# Patient Record
Sex: Male | Born: 1980 | Race: Black or African American | Hispanic: No | Marital: Single | Smoking: Never smoker
Health system: Southern US, Community
[De-identification: ages and names within clinical notes are randomized; demographics above are authoritative.]

## PROBLEM LIST (undated history)

## (undated) ENCOUNTER — Emergency Department (HOSPITAL_COMMUNITY): Discharge status: Absent without leave | Payer: Self-pay

---

## 2000-09-23 ENCOUNTER — Emergency Department (HOSPITAL_COMMUNITY): Admission: EM | Admit: 2000-09-23 | Discharge: 2000-09-23 | Payer: Self-pay | Admitting: Emergency Medicine

## 2001-07-12 ENCOUNTER — Emergency Department (HOSPITAL_COMMUNITY): Admission: EM | Admit: 2001-07-12 | Discharge: 2001-07-12 | Payer: Self-pay | Admitting: Emergency Medicine

## 2010-07-16 ENCOUNTER — Emergency Department (HOSPITAL_BASED_OUTPATIENT_CLINIC_OR_DEPARTMENT_OTHER)
Admission: EM | Admit: 2010-07-16 | Discharge: 2010-07-17 | Disposition: A | Discharge status: Home / Self Care | Payer: Self-pay | Attending: Emergency Medicine | Admitting: Emergency Medicine

## 2010-07-16 DIAGNOSIS — X12XXXA Contact with other hot fluids, initial encounter: Secondary | ICD-10-CM | POA: Insufficient documentation

## 2010-07-16 DIAGNOSIS — T22119A Burn of first degree of unspecified forearm, initial encounter: Secondary | ICD-10-CM | POA: Insufficient documentation

## 2010-07-16 DIAGNOSIS — T2019XA Burn of first degree of multiple sites of head, face, and neck, initial encounter: Secondary | ICD-10-CM | POA: Insufficient documentation

## 2011-05-22 ENCOUNTER — Encounter (HOSPITAL_COMMUNITY): Payer: Self-pay | Admitting: Emergency Medicine

## 2011-05-22 ENCOUNTER — Emergency Department (HOSPITAL_COMMUNITY)
Admission: EM | Admit: 2011-05-22 | Discharge: 2011-05-22 | Disposition: A | Discharge status: Home / Self Care | Payer: BC Managed Care – PPO | Attending: Emergency Medicine | Admitting: Emergency Medicine

## 2011-05-22 ENCOUNTER — Emergency Department (HOSPITAL_COMMUNITY): Payer: BC Managed Care – PPO

## 2011-05-22 DIAGNOSIS — J111 Influenza due to unidentified influenza virus with other respiratory manifestations: Secondary | ICD-10-CM

## 2011-05-22 MED ORDER — IBUPROFEN 600 MG PO TABS: 600.0000 mg | ORAL_TABLET | Freq: Four times a day (QID) | ORAL | Status: AC | PRN

## 2011-05-22 MED ORDER — GUAIFENESIN 100 MG/5ML PO SYRP: 100.0000 mg | ORAL_SOLUTION | ORAL | Status: AC | PRN

## 2011-05-22 NOTE — ED Provider Notes (Signed)
Medical screening examination/treatment/procedure(s) were performed by non-physician practitioner and as supervising physician I was immediately available for consultation/collaboration.   Hanley Seamen, MD 05/22/11 [phone]

## 2011-05-22 NOTE — ED Provider Notes (Signed)
History     CSN: [id number]  Arrival date & time 05/22/11  0441   First MD Initiated Contact with Patient 05/22/11 [phone]      Chief Complaint  Patient presents with  . URI    Cold, shivers, lungs hurt    (Consider location/radiation/quality/duration/timing/severity/associated sxs/prior treatment) Patient is a 31 y.o. male presenting with URI. The history is provided by the patient.  URI The primary symptoms include fever. Primary symptoms do not include fatigue, headaches, ear pain, sore throat, abdominal pain, nausea, vomiting, myalgias or rash.  Symptoms associated with the illness include chills. The illness is not associated with congestion or rhinorrhea.   31 year old male with a pertinent past medical history presents with cough, myalgias, sore throat, and low-grade fever and chills which started on Monday. No known sick contacts. States the cough has been nonproductive in nature. He has not actually taken his temperature, but has felt warm. Denies any rhinorrhea, sinus pressure, ear ache, headache. No CP/SOB. He does have a history of asthma. Has taken Tylenol for the fever and has not taken any cough syrup.  History reviewed. No pertinent past medical history.  History reviewed. No pertinent past surgical history.  No family history on file.  History  Substance Use Topics  . Smoking status: Never Smoker   . Smokeless tobacco: Not on file  . Alcohol Use: Yes      Review of Systems  Constitutional: Positive for fever and chills. Negative for activity change, appetite change and fatigue.  HENT: Negative for ear pain, congestion, sore throat, rhinorrhea, trouble swallowing, neck pain and neck stiffness.   Eyes: Negative for discharge and redness.  Respiratory: Negative for chest tightness and shortness of breath.   Cardiovascular: Negative for chest pain and palpitations.  Gastrointestinal: Negative for nausea, vomiting, abdominal pain and diarrhea.  Musculoskeletal:  Negative for myalgias.  Skin: Negative for color change and rash.  Neurological: Negative for headaches.    Allergies  Review of patient's allergies indicates no known allergies.  Home Medications  No current outpatient prescriptions on file.  BP 117/65  Pulse 64  Temp(Src) 97.7 F (36.5 C) (Oral)  Resp 18  SpO2 100%  Physical Exam  Nursing note and vitals reviewed. Constitutional: He is oriented to person, place, and time. He appears well-developed and well-nourished. No distress.  HENT:  Head: Normocephalic and atraumatic.  Right Ear: External ear normal.  Left Ear: External ear normal.  Mouth/Throat: Oropharynx is clear and moist. No oropharyngeal exudate.       Tympanic membranes normal bilaterally. No sinus tenderness to palpation.  Eyes: Conjunctivae and EOM are normal. Pupils are equal, round, and reactive to light. Right eye exhibits no discharge. Left eye exhibits no discharge.  Neck: Normal range of motion. Neck supple.  Cardiovascular: Normal rate, regular rhythm and normal heart sounds.   Pulmonary/Chest: Effort normal and breath sounds normal. He has no wheezes. He exhibits no tenderness.  Abdominal: Soft. Bowel sounds are normal. There is no tenderness.  Musculoskeletal: Normal range of motion.  Lymphadenopathy:    He has no cervical adenopathy.  Neurological: He is alert and oriented to person, place, and time.  Skin: Skin is warm and dry. No rash noted. He is not diaphoretic.  Psychiatric: He has a normal mood and affect.    ED Course  Procedures (including critical care time)  Labs Reviewed - No data to display Dg Chest 2 View  05/22/2011  *RADIOLOGY REPORT*  Clinical Data: Low grade fevers, body  aches, cough, congestion, chest tightness  CHEST - 2 VIEW  Comparison: None.  Findings: The cardiac silhouette, mediastinum, pulmonary vasculature are within normal limits.  Both lungs are clear. There is no acute bony abnormality.  IMPRESSION: There is no  evidence of acute cardiac or pulmonary process.  Original Report Authenticated By: Brandon Melnick, M.D.     1. Influenza-like illness       MDM  MDM Number of Diagnoses or Management Options Influenza-like illness: new, needed workup Diagnosis management comments: Otherwise healthy pt with URI like sx for past several days. Low grade fever self reported at home with myalgias. CXR performed which does not show evidence of PNA. Suspect likely URI/ILI. Will tx symptomatically. Return precautions discussed.    Amount and/or Complexity of Data Reviewed Clinical lab tests: ordered and reviewed Tests in the radiology section of CPT: ordered and reviewed Independent visualization of images, tracings, or specimens: yes  Risk of Complications, Morbidity, and/or Mortality Presenting problems: low Diagnostic procedures: minimal Management options: low  Patient Progress Patient progress: stable         Grant Fontana, [place] 05/22/11 [phone]

## 2011-05-22 NOTE — Discharge Instructions (Signed)
Your chest x-ray did not show any signs of pneumonia. This is likely a viral infection which cannot be treated with antibiotics. Please use the cough syrup as needed. Use the Motrin for body aches and/or fever. Return to the ED with shortness of breath, chest pain, or otherwise worsening condition.  RESOURCE GUIDE  Dental Problems  Patients with Medicaid: [facility] [phone] [address]                                           [phone] [address] Phone:  [phone]                                                  Phone:  [phone]  If unable to pay or uninsured, contact:  [facility] or [facility]. to become qualified for the adult dental clinic.  Chronic Pain Problems Contact [facility]  [phone] Patients need to be referred by their primary care doctor.  Insufficient Money for Medicine Contact [employer]:  call "211" or [facility] [phone].  No Primary Care Doctor Call [facility]  [phone] Other agencies that provide inexpensive medical care    [facility]  [phone]    [facility]  [phone]    [facility]  [phone]    [facility]  [phone]    [facility]  [phone]    [facility]  [phone]  Psychological Services [facility]  [phone] [facility]  [phone] [facility]   [phone] (emergency services [phone])  Substance Abuse Resources [facility]  ([phone] [facility] [phone] [facility] [phone] Floydene Flock [phone] Residential & Outpatient Substance Abuse Program  ([phone]  Abuse/Neglect [facility] Child Abuse Hotline 442-857-2813 [facility] Child Abuse Hotline [phone] (After Hours)  Emergency Shelter [facility] ([phone]  M[facility] [place] [facility] [phone] [facility] (406) [phone]  MRSA Hotline #:   9547860447    [facility] Resources  [facility]     [employer]                          [facility] Dept. [address] [place]                       [address]      [address]  [place]                                                Cristobal Goldmann Phone:  [phone]  Phone:  [phone]                 Phone:  [phone]  [place] [facility] Phone:  [phone]  [place] Child Abuse Hotline (336) 342-1394 [phone]) [phone] (After Hours)   

## 2011-05-22 NOTE — ED Notes (Signed)
MD at bedside. 

## 2011-05-22 NOTE — ED Notes (Signed)
Patient returned from X-ray 

## 2011-05-22 NOTE — ED Notes (Signed)
Patient transported to X-ray 

## 2013-05-30 ENCOUNTER — Emergency Department (HOSPITAL_COMMUNITY)
Admission: EM | Admit: 2013-05-30 | Discharge: 2013-05-30 | Disposition: A | Discharge status: Home / Self Care | Payer: BC Managed Care – PPO | Attending: Emergency Medicine | Admitting: Emergency Medicine

## 2013-05-30 ENCOUNTER — Emergency Department (HOSPITAL_COMMUNITY): Payer: BC Managed Care – PPO

## 2013-05-30 ENCOUNTER — Encounter (HOSPITAL_COMMUNITY): Payer: Self-pay | Admitting: Emergency Medicine

## 2013-05-30 DIAGNOSIS — IMO0002 Reserved for concepts with insufficient information to code with codable children: Secondary | ICD-10-CM | POA: Insufficient documentation

## 2013-05-30 DIAGNOSIS — Y9302 Activity, running: Secondary | ICD-10-CM | POA: Insufficient documentation

## 2013-05-30 DIAGNOSIS — Y929 Unspecified place or not applicable: Secondary | ICD-10-CM | POA: Insufficient documentation

## 2013-05-30 DIAGNOSIS — S20219A Contusion of unspecified front wall of thorax, initial encounter: Secondary | ICD-10-CM | POA: Insufficient documentation

## 2013-05-30 MED ORDER — NAPROXEN 500 MG PO TABS: 500.0000 mg | ORAL_TABLET | Freq: Two times a day (BID) | ORAL | Status: DC

## 2013-05-30 NOTE — ED Provider Notes (Signed)
Medical screening examination/treatment/procedure(s) were performed by non-physician practitioner and as supervising physician I was immediately available for consultation/collaboration.   EKG Interpretation None        Loren Raceravid Winda Summerall, MD 05/30/13 [phone]

## 2013-05-30 NOTE — ED Provider Notes (Signed)
CSN: [id number]     Arrival date & time 05/30/13  4098 History   First MD Initiated Contact with Patient 05/30/13 0435     Chief Complaint  Patient presents with  . Rib Injury    (Consider location/radiation/quality/duration/timing/severity/associated sxs/prior Treatment) Patient is a 33 y.o. male presenting with chest pain. The history is provided by the patient. No language interpreter was used.  Chest Pain Pain location:  R lateral chest Pain quality: aching, stabbing and throbbing   Pain radiates to:  Does not radiate Pain radiates to the back: no   Pain severity:  Mild Onset quality:  Sudden Duration:  2 weeks Timing:  Intermittent Progression:  Unchanged Chronicity:  New Context: trauma (Patient hit right side of chest wall on the corner of a counter in the kitchen 2.5 weeks ago)   Relieved by:  Nothing Worsened by:  Deep breathing and movement Ineffective treatments: Tylenol and ibuprofen. Associated symptoms: no back pain, no cough, no diaphoresis, no fever, no near-syncope, no palpitations, no shortness of breath, no syncope and no weakness     History reviewed. No pertinent past medical history. History reviewed. No pertinent past surgical history. History reviewed. No pertinent family history. History  Substance Use Topics  . Smoking status: Never Smoker   . Smokeless tobacco: Not on file  . Alcohol Use: Yes    Review of Systems  Constitutional: Negative for fever and diaphoresis.  Respiratory: Negative for cough and shortness of breath.        Chest wall pain  Cardiovascular: Positive for chest pain. Negative for palpitations, syncope and near-syncope.  Musculoskeletal: Negative for back pain.  Neurological: Negative for syncope and weakness.  All other systems reviewed and are negative.     Allergies  Review of patient's allergies indicates no known allergies.  Home Medications   Current Outpatient Rx  Name  Route  Sig  Dispense  Refill  .  acetaminophen (TYLENOL) 500 MG tablet   Oral   Take 500 mg by mouth every 6 (six) hours as needed for mild pain.         . naproxen (NAPROSYN) 500 MG tablet   Oral   Take 1 tablet (500 mg total) by mouth 2 (two) times daily.   30 tablet   0    BP 112/68  Pulse 70  Temp(Src) 97.5 F (36.4 C) (Oral)  Resp 20  Ht 5\' 8"  (1.727 m)  Wt 165 lb (74.844 kg)  BMI 25.09 kg/m2  SpO2 98%  Physical Exam  Nursing note and vitals reviewed. Constitutional: He is oriented to person, place, and time. He appears well-developed and well-nourished. No distress.  HENT:  Head: Normocephalic and atraumatic.  Eyes: Conjunctivae and EOM are normal. No scleral icterus.  Neck: Normal range of motion.  Cardiovascular: Normal rate, regular rhythm and normal heart sounds.   Pulmonary/Chest: Effort normal and breath sounds normal. No respiratory distress. He has no wheezes. He has no rales. He exhibits tenderness and bony tenderness. He exhibits no crepitus, no deformity and no retraction.    Musculoskeletal: Normal range of motion.  Neurological: He is alert and oriented to person, place, and time.  Skin: Skin is warm and dry. No rash noted. He is not diaphoretic. No erythema. No pallor.  Psychiatric: He has a normal mood and affect. His behavior is normal.    ED Course  Procedures (including critical care time) Labs Review Labs Reviewed - No data to display Imaging Review Dg Ribs Unilateral W/chest  Right  05/30/2013   CLINICAL DATA:  History of trauma from a fall 2 weeks ago complaining of right lateral rib pain and shortness of breath.  EXAM: RIGHT RIBS AND CHEST - 3+ VIEW  COMPARISON:  Chest x-ray 05/22/2011.  FINDINGS: Lung volumes are normal. No consolidative airspace disease. No pleural effusions. No pneumothorax. No pulmonary nodule or mass noted. Pulmonary vasculature and the cardiomediastinal silhouette are within normal limits.  Dedicated views of the right ribs demonstrate a radiopaque  marker in place over the lower right ribs. No acute displaced right-sided rib fractures are noted.  IMPRESSION: 1. No acute displaced right rib fractures. 2. No radiographic evidence of acute cardiopulmonary disease.   Electronically Signed   By: Trudie Reedaniel  Entrikin M.D.   On: 05/30/2013 05:20     EKG Interpretation None      MDM   Final diagnoses:  Chest wall contusion    Uncomplicated chest wall contusion after patient had his right anterior lateral chest hit the corner of his kitchen counter 2.5 weeks ago. Pain is nonradiating. Patient has point tenderness on exam. No crepitus or retractions. Chest expansion symmetric in patient with clear and normal breath sounds bilaterally. No tachypnea, dyspnea, or hypoxia. Imaging today negative for evidence of pneumothorax or rib fracture. Symptoms consistent with chest wall contusion. Patient states inappropriate for discharge with instruction to take naproxen for pain control. Return precautions provided and patient agreeable to plan with no unaddressed concerns.   Filed Vitals:   05/30/13 0439  BP: 112/68  Pulse: 70  Temp: 97.5 F (36.4 C)  TempSrc: Oral  Resp: 20  Height: 5\' 8"  (1.727 m)  Weight: 165 lb (74.844 kg)  SpO2: 98%     Antony MaduraKelly Shawntez Dickison, PA-C 05/30/13 [phone]
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
## 2013-05-30 NOTE — Discharge Instructions (Signed)
Your x-ray today is negative for rib fracture. Recommend naproxen as prescribed for pain. Apply ice to the area 2-3 times per day. Return if symptoms worsen.  Chest Contusion A chest contusion is a deep bruise on your chest area. Contusions are the result of an injury that caused bleeding under the skin. A chest contusion may involve bruising of the skin, muscles, or ribs. The contusion may turn blue, purple, or yellow. Minor injuries will give you a painless contusion, but more severe contusions may stay painful and swollen for a few weeks. CAUSES  A contusion is usually caused by a blow, trauma, or direct force to an area of the body. SYMPTOMS   Swelling and redness of the injured area.  Discoloration of the injured area.  Tenderness and soreness of the injured area.  Pain. DIAGNOSIS  The diagnosis can be made by taking a history and performing a physical exam. An X-ray, CT scan, or MRI may be needed to determine if there were any associated injuries, such as broken bones (fractures) or internal injuries. TREATMENT  Often, the best treatment for a chest contusion is resting, icing, and applying cold compresses to the injured area. Deep breathing exercises may be recommended to reduce the risk of pneumonia. Over-the-counter medicines may also be recommended for pain control. HOME CARE INSTRUCTIONS   Put ice on the injured area.  Put ice in a plastic bag.  Place a towel between your skin and the bag.  Leave the ice on for 15-20 minutes, 03-04 times a day.  Only take over-the-counter or prescription medicines as directed by your caregiver. Your caregiver may recommend avoiding anti-inflammatory medicines (aspirin, ibuprofen, and naproxen) for 48 hours because these medicines may increase bruising.  Rest the injured area.  Perform deep-breathing exercises as directed by your caregiver.  Stop smoking if you smoke.  Do not lift objects over 5 pounds (2.3 kg) for 3 days or longer if  recommended by your caregiver. SEEK IMMEDIATE MEDICAL CARE IF:   You have increased bruising or swelling.  You have pain that is getting worse.  You have difficulty breathing.  You have dizziness, weakness, or fainting.  You have blood in your urine or stool.  You cough up or vomit blood.  Your swelling or pain is not relieved with medicines. MAKE SURE YOU:   Understand these instructions.  Will watch your condition.  Will get help right away if you are not doing well or get worse. Document Released: 11/19/2000 Document Revised: 11/19/2011 Document Reviewed: 08/18/2011 [facility] Patient Information 2014 [place], [place].

## 2013-06-08 ENCOUNTER — Encounter (HOSPITAL_COMMUNITY): Payer: Self-pay | Admitting: Emergency Medicine

## 2013-06-08 ENCOUNTER — Emergency Department (HOSPITAL_COMMUNITY)
Admission: EM | Admit: 2013-06-08 | Discharge: 2013-06-08 | Disposition: A | Discharge status: Home / Self Care | Payer: BC Managed Care – PPO | Attending: Emergency Medicine | Admitting: Emergency Medicine

## 2013-06-08 DIAGNOSIS — Y9389 Activity, other specified: Secondary | ICD-10-CM | POA: Insufficient documentation

## 2013-06-08 DIAGNOSIS — S199XXA Unspecified injury of neck, initial encounter: Principal | ICD-10-CM

## 2013-06-08 DIAGNOSIS — Z791 Long term (current) use of non-steroidal anti-inflammatories (NSAID): Secondary | ICD-10-CM | POA: Insufficient documentation

## 2013-06-08 DIAGNOSIS — Y92410 Unspecified street and highway as the place of occurrence of the external cause: Secondary | ICD-10-CM | POA: Insufficient documentation

## 2013-06-08 DIAGNOSIS — S298XXA Other specified injuries of thorax, initial encounter: Secondary | ICD-10-CM | POA: Insufficient documentation

## 2013-06-08 DIAGNOSIS — S0993XA Unspecified injury of face, initial encounter: Secondary | ICD-10-CM | POA: Insufficient documentation

## 2013-06-08 MED ORDER — CYCLOBENZAPRINE HCL 10 MG PO TABS: 10.0000 mg | ORAL_TABLET | Freq: Two times a day (BID) | ORAL | Status: DC | PRN

## 2013-06-08 MED ORDER — IBUPROFEN 800 MG PO TABS: 800.0000 mg | ORAL_TABLET | Freq: Three times a day (TID) | ORAL | Status: DC

## 2013-06-08 MED ORDER — HYDROCODONE-ACETAMINOPHEN 5-325 MG PO TABS: 1.0000 | ORAL_TABLET | Freq: Four times a day (QID) | ORAL | Status: DC | PRN
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
## 2013-06-08 NOTE — ED Provider Notes (Signed)
Medical screening examination/treatment/procedure(s) were performed by non-physician practitioner and as supervising physician I was immediately available for consultation/collaboration.   EKG Interpretation None        Gilda Creasehristopher J. Shadman Tozzi, MD 06/08/13 [phone]
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
## 2013-12-14 ENCOUNTER — Emergency Department (HOSPITAL_COMMUNITY): Payer: BC Managed Care – PPO

## 2013-12-14 ENCOUNTER — Emergency Department (HOSPITAL_COMMUNITY)
Admission: EM | Admit: 2013-12-14 | Discharge: 2013-12-14 | Disposition: A | Discharge status: Home / Self Care | Payer: BC Managed Care – PPO | Attending: Emergency Medicine | Admitting: Emergency Medicine

## 2013-12-14 ENCOUNTER — Encounter (HOSPITAL_COMMUNITY): Payer: Self-pay | Admitting: Emergency Medicine

## 2013-12-14 DIAGNOSIS — Y9289 Other specified places as the place of occurrence of the external cause: Secondary | ICD-10-CM | POA: Insufficient documentation

## 2013-12-14 DIAGNOSIS — W208XXA Other cause of strike by thrown, projected or falling object, initial encounter: Secondary | ICD-10-CM | POA: Insufficient documentation

## 2013-12-14 DIAGNOSIS — S62629A Displaced fracture of medial phalanx of unspecified finger, initial encounter for closed fracture: Secondary | ICD-10-CM

## 2013-12-14 DIAGNOSIS — S62622A Displaced fracture of medial phalanx of right middle finger, initial encounter for closed fracture: Secondary | ICD-10-CM | POA: Insufficient documentation

## 2013-12-14 DIAGNOSIS — Y9389 Activity, other specified: Secondary | ICD-10-CM | POA: Insufficient documentation

## 2013-12-14 NOTE — ED Provider Notes (Signed)
CSN: [id number]     Arrival date & time 12/14/13  1039 History  This chart was scribed for Oswaldo Conroy, [place] with Shon Baton, MD by Tonye Royalty, ED Scribe. This patient was seen in room WTR8/WTR8 and the patient's care was started at 11:59 AM.    Chief Complaint  Patient presents with  . Finger Injury   The history is provided by the patient. No language interpreter was used.    HPI Comments: Rodney Mclaughlin is a 33 y.o. male who presents to the Emergency Department complaining of injury to his right middle finger yesterday at 3:00PM when metal pipe he was lifting fell on it. He rates his pain now at 7/10 and is pulsing. He states the pain has not increased yesterday but the swelling has increased. He denies a wound. He states he has not taken any medication for his pain. He denies fever, chills, nausea, vomiting, or diarrhea.  History reviewed. No pertinent past medical history. History reviewed. No pertinent past surgical history. History reviewed. No pertinent family history. History  Substance Use Topics  . Smoking status: Never Smoker   . Smokeless tobacco: Not on file  . Alcohol Use: Yes    Review of Systems  Constitutional: Negative for fever and chills.  Gastrointestinal: Negative for nausea, vomiting and diarrhea.  Musculoskeletal:       Swelling and pain to finger      Allergies  Review of patient's allergies indicates no known allergies.  Home Medications   Prior to Admission medications   Not on File   BP 115/76  Pulse 76  Temp(Src) 98.7 F (37.1 C) (Oral)  Resp 20  SpO2 100% Physical Exam  Nursing note and vitals reviewed. Constitutional: He appears well-developed and well-nourished. No distress.  HENT:  Head: Normocephalic and atraumatic.  Eyes: Conjunctivae are normal. Right eye exhibits no discharge. Left eye exhibits no discharge.  Cardiovascular:  2+ bilateral radial pulses  Pulmonary/Chest: Effort normal. No respiratory distress.   Musculoskeletal: Normal range of motion. He exhibits tenderness (distal right middle finger).  5/5 strength in hand and wrist. Patient moves all fingers. Right middle phalanx tip with erythema and edema, pain with ROM. No subungual hematoma.  Neurological: He is alert. Coordination normal.  Skin: He is not diaphoretic.  Psychiatric: He has a normal mood and affect. His behavior is normal.    ED Course  Procedures (including critical care time) Labs Review Labs Reviewed - No data to display  Imaging Review Dg Finger Middle Right  12/14/2013   CLINICAL DATA:  Injury to the distal aspect of RIGHT middle finger yesterday 1500 hr at work, middle finger caught/smashed between metal pipes, initial encounter  EXAM: RIGHT MIDDLE FINGER 2+V  COMPARISON:  None  FINDINGS: Osseous mineralization normal.  Joint spaces preserved.  Essentially nondisplaced tuft fracture at distal phalanx RIGHT middle finger.  No extension to DIP joint.  No additional fracture, dislocation or bone destruction.  IMPRESSION: Essentially nondisplaced tuft fracture at distal phalanx RIGHT middle finger.   Electronically Signed   By: Ulyses Southward M.D.   On: 12/14/2013 12:34     EKG Interpretation None     DIAGNOSTIC STUDIES: Oxygen Saturation is 100% on room air, normal by my interpretation.    COORDINATION OF CARE: 12:04 PM Will order x-ray to rule out acute fracture. Discussed with the patient the possibility that swelling is resulting in compression of the vein and possible compartment syndrome. Patient agrees with the plan and has no  further questions at this time. He declines Ibuprofen.  Spoke with Dr. Wilkie AyeHorton. X-ray shows evidence of fracture. Since examination shows no evidence of hemorrhage, will administer a splint. Instructed patient to return if symptoms worsen. And follow up with Orthopedics/hand.   MDM   Final diagnoses:  Fracture of finger, middle phalanx, right, closed, initial encounter   Patient with  tuff fracture, nondisplaced of right middle finger, closed fracture. VSS. Neurovascularly intact. Pain with ROM, no subungual hematoma. Patient without fevers, chills, severe pain. I doubt compartment syndrome. Patient without subungual hematoma. It is a closed fracture. Patient given return precautions for compartment syndrome. Patient to follow up with Dr. Melvyn Novasrtmann with Hand/orthopedics. In formation provided. RICE protocol and pain medications indicated but Patient did not want any pain meds in ED or narcotics to take home. Discussed other symptomatic treatment.   Discussed return precautions with patient. Discussed all results and patient verbalizes understanding and agrees with plan.  This is a shared patient. This patient was discussed with the physician who saw and evaluated the patient and agrees with the plan.  I personally performed the services described in this documentation, which was scribed in my presence. The recorded information has been reviewed and is accurate.     Louann SjogrenVictoria L Silvana Holecek, PA-C 12/14/13 1422

## 2013-12-14 NOTE — Discharge Instructions (Signed)
Return to the emergency room with worsening of symptoms, new symptoms or with symptoms that are concerning, especially fevers, swelling, redness, increased pain. Read attachement for compartment syndrome. Call number above for appointment to see hand surgeon for further evaluation and management of fracture. RICE: Rest, Ice (three cycles of 20 mins on, 20mins off at least twice a day), compression/brace, elevation. Heating pad works well for back pain. Ibuprofen 400mg  (2 tablets 200mg ) every 5-6 hours for 3-5 days and then as needed for pain.

## 2013-12-14 NOTE — ED Notes (Signed)
Smashed rt middle finger in between furniture

## 2013-12-17 NOTE — ED Provider Notes (Signed)
Medical screening examination/treatment/procedure(s) were conducted as a shared visit with non-physician practitioner(s) and myself.  I personally evaluated the patient during the encounter.   EKG Interpretation None      Patient presents with gross injury to the right third finger. No obvious open  Injury. No subungual hematoma. Moderate swelling. Evidence of tuft fracture. We'll splint and patient will be discharged home with pain management.  Shon Batonourtney F Johnn Krasowski, MD 12/17/13 [phone]

## 2015-08-28 ENCOUNTER — Emergency Department (HOSPITAL_COMMUNITY): Payer: No Typology Code available for payment source

## 2015-08-28 ENCOUNTER — Encounter (HOSPITAL_COMMUNITY): Payer: Self-pay | Admitting: Emergency Medicine

## 2015-08-28 ENCOUNTER — Emergency Department (HOSPITAL_COMMUNITY)
Admission: EM | Admit: 2015-08-28 | Discharge: 2015-08-28 | Disposition: A | Discharge status: Home / Self Care | Payer: No Typology Code available for payment source | Attending: Emergency Medicine | Admitting: Emergency Medicine

## 2015-08-28 DIAGNOSIS — Y939 Activity, unspecified: Secondary | ICD-10-CM | POA: Insufficient documentation

## 2015-08-28 DIAGNOSIS — Y999 Unspecified external cause status: Secondary | ICD-10-CM | POA: Insufficient documentation

## 2015-08-28 DIAGNOSIS — M20012 Mallet finger of left finger(s): Secondary | ICD-10-CM | POA: Insufficient documentation

## 2015-08-28 DIAGNOSIS — W1839XA Other fall on same level, initial encounter: Secondary | ICD-10-CM | POA: Insufficient documentation

## 2015-08-28 DIAGNOSIS — Y929 Unspecified place or not applicable: Secondary | ICD-10-CM | POA: Insufficient documentation

## 2015-08-28 NOTE — ED Provider Notes (Signed)
CSN: [id number]     Arrival date & time 08/28/15  1225 History  By signing my name below, I, Soijett Blue, attest that this documentation has been prepared under the direction and in the presence of Wynetta EmeryNicole Dedria Endres, PA-C Electronically Signed: Soijett Blue, ED Scribe. 08/28/2015. 2:09 PM.   Chief Complaint  Patient presents with  . Finger Injury      The history is provided by the patient. No language interpreter was used.    Wilma FlavinSandy Eilts is a 35 y.o. male who presents to the Emergency Department complaining of left pinky finger injury occurring 3 days ago. Pt reports that he fell striking his left hand 4 days ago and he is right hand dominant. Pt notes that he noticed the deformity 8 hours following the incident. Pt notes that he is unable to straighten the tip of his finger. Pt denies pain to the area. He notes that he has not tried any medications for the relief of his symptoms. He denies color change, wound, rash, swelling, and any other symptoms.   No past medical history on file. No past surgical history on file. No family history on file. Social History  Substance Use Topics  . Smoking status: Never Smoker   . Smokeless tobacco: Not on file  . Alcohol Use: Yes    Review of Systems  A complete 10 system review of systems was obtained and all systems are negative except as noted in the HPI and PMH.   Allergies  Review of patient's allergies indicates no known allergies.  Home Medications   Prior to Admission medications   Not on File   BP 129/67 mmHg  Pulse 78  Temp(Src) 98.6 F (37 C) (Oral)  Resp 18  SpO2 100% Physical Exam  Constitutional: He is oriented to person, place, and time. He appears well-developed and well-nourished. No distress.  HENT:  Head: Normocephalic and atraumatic.  Eyes: EOM are normal.  Neck: Neck supple.  Cardiovascular: Normal rate.   Pulmonary/Chest: Effort normal. No respiratory distress.  Abdominal: He exhibits no distension.   Musculoskeletal: Normal range of motion.  No overlying skin changes. No nailbed injury. Pt keeps the DIP of left 5th digit in flexion. No active extension. Distally and NVI.  Neurological: He is alert and oriented to person, place, and time.  Skin: Skin is warm and dry.  Psychiatric: He has a normal mood and affect. His behavior is normal.  Nursing note and vitals reviewed.   ED Course  Procedures (including critical care time) DIAGNOSTIC STUDIES: Oxygen Saturation is 100% on RA, nl by my interpretation.    COORDINATION OF CARE: 2:09 PM Discussed treatment plan with pt at bedside which includes left little finger xray and referral to orthopedist, and pt agreed to plan.    Labs Review Labs Reviewed - No data to display  Imaging Review Dg Finger Little Left  08/28/2015  CLINICAL DATA:  Deformity. EXAM: LEFT LITTLE FINGER 2+V COMPARISON:  No recent prior. FINDINGS: No acute soft tissue or bony abnormality identified. Flexion deformity noted of the DIP. No radiopaque foreign body. IMPRESSION: Flexion deformity noted of the DIP. No acute bony or joint abnormalities identified. Electronically Signed   By: Maisie Fushomas  Register   On: 08/28/2015 13:10   I have personally reviewed and evaluated these images as part of my medical decision-making.   EKG Interpretation None      MDM   Final diagnoses:  Mallet finger, acquired, left    Filed Vitals:   08/28/15  1240 08/28/15 1448  BP: 129/67   Pulse: 78 64  Temp: 98.6 F (37 C)   TempSrc: Oral   Resp: 18   SpO2: 100% 100%    Okechukwu Regnier is 35 y.o. male presenting with Left fifth digit mallet finger. Patient states that he fell 5 days ago there's been no pain, x-ray negative, patient placed in splints, call Dr. Merrilee Seashore office who has generously made appointment for tomorrow.  Evaluation does not show pathology that would require ongoing emergent intervention or inpatient treatment. Pt is hemodynamically stable and mentating  appropriately. Discussed findings and plan with patient/guardian, who agrees with care plan. All questions answered. Return precautions discussed and outpatient follow up given.       Joni Reining Joscelyne Renville, PA-C 08/28/15 1610  Vanetta Mulders, MD 08/29/15 1004

## 2015-08-28 NOTE — ED Notes (Signed)
Pt with deformed left pinky finger x 3 days.  Does not recall injury but cannot straighten the tip.  Pt denies pain

## 2015-08-28 NOTE — Discharge Instructions (Signed)
Do not hesitate to return to the emergency room for any new, worsening or concerning symptoms.  Please obtain primary care using resource guide below. Let them know that you were seen in the emergency room and that they will need to obtain records for further outpatient management.     Mallet Finger Mallet finger is an injury that occurs from a blow to the tip of your straightened finger or thumb. It is also known as baseball finger. The blow to your fingertip causes it to bend farther than normal, which tears the cord that attaches to the tip of your finger (extensor tendon). Your extensor tendon is what straightens the end of your finger. If this tendon is damaged, you will not be able to straighten your fingertip. Sometimes, a piece of bone may be pulled away with the tendon (avulsion injury), or the tendon may tear completely. In some cases, surgery may be required to repair the damage. CAUSES Mallet finger is caused by a hard, direct hit to the tip of your finger or thumb. This injury often happens from getting hit in the finger with a hard ball, such as a baseball. RISK FACTORS This injury is more likely to happen if you play sports that use a hard ball. SYMPTOMS  The main symptom of this injury is not being able to straighten the tip of your finger. You can manually straighten your fingertip with your other hand, but the finger cannot straighten on its own. Other symptoms may include:  Pain.  Swelling.  Bruising.  Blood under the fingernail. DIAGNOSIS  Your health care provider may suspect mallet finger if you are not able to extend your fingertip, especially if you recently injured your hand. Your health care provider will do a physical exam. This may include X-rays to see if a piece of bone has been pulled away or if the finger joint has separated (dislocated). TREATMENT  Mallet finger may be treated with:  Wearing a splint on your fingertip to keep it straight (extended) while  the tendon heals.  Surgery to repair the tendon, in severe cases. This may involve:  The use of a pin or screw to keep your finger extended and your tendon attached.  Taking a piece of tendon from another part of your body (graft) to replace a torn tendon. HOME CARE INSTRUCTIONS   Take medicines only as directed by your health care provider.  Wear the splint as directed by your health care provider. Remove it only as directed by your health care provider.  If you take your splint off to dry it or change it, gently press your finger on a flat surface to keep it straight.  If directed, apply ice to the injured area:   Put ice in a plastic bag.   Place a towel between your skin and the bag.   Leave the ice on for 20 minutes, 2-3 times a day.  Raise the injured area above the level of your heart while you are sitting or lying down. SEEK MEDICAL CARE IF:   You have pain or swelling that is getting worse.   Your finger feels cold.   You cannot extend your finger after treatment. SEEK IMMEDIATE MEDICAL CARE IF:   Even after loosening your splint, your finger is:  Very red and swollen.  White or blue.  Numb or tingling.   This information is not intended to replace advice given to you by your health care provider. Make sure you discuss any questions  you have with your health care provider.   Document Released: 02/22/2000 Document Revised: 07/11/2014 Document Reviewed: 12/28/2013 Elsevier Interactive Patient Education 2016 [employer]. [employer] Assistance The United Ways 211 is a great source of information about community services available.  Access by dialing 2-1-1 from anywhere in [place], or by website -  PooledIncome.pl.   Other Local Resources (Updated 03/2015)  Financial Assistance   Services    Phone Number and Address  [facility]  Low-cost medical care - 1st and 3rd Saturday of every month  Must not  qualify for public or private insurance and must have limited income [phone] [address] [place], [place]    [place] [employer] of Social Services  Child care  Emergency assistance for housing and [employer]  Medicaid [phone] [address] [place], [place] [address]   [facility] Department  Low-cost medical care for children, communicable diseases, sexually-transmitted diseases, immunizations, maternity care, womens health and family planning [phone] [address] [place], [place] [address]  [facility]   Medication assistance for [facility] residents  Must meet income requirements [phone] [address] [place], [place].    [facility] Social Services  Child care  Emergency assistance for housing and [employer]  Medicaid [phone] [address] [place], [place] [address]  [facility]   Low-cost medical care,   Monday through Friday, 9 am to 6 pm.   Accepts Medicare/Medicaid, and self-pay [phone] [address] [place], [place] [address]  [facility]  Low-cost medical care - Monday through Friday, 8:30 am - 5:30 pm  Accepts Medicaid and self-pay [phone] [address] [place], [place] [address]   [facility]  Primary medical care, including for those with sickle cell disease  Accepts Medicare, Medicaid, insurance and self-pay [phone] [address] [employer], [place]  [facility]   Primary medical care  Accepts Medicare, [place], insurance and self-pay [phone] [address] [place], [place] [address]   [facility] Department of Social Services  Child care  Emergency assistance for housing and [employer]  Medicaid [phone] [address]  [place], [place] [address]  [facility] Department of Health and [employer]  Child care  Emergency assistance for housing and [employer]  Medicaid [phone] [address] [place], [place] [address]   [facility] Medication Assistance Program  Medication assistance for [facility] residents with no insurance only  Must have a primary care doctor [phone] [address] [place], [place]  [facility]   Primary medical care  [place], [place], insurance  [phone] [address] [place], [place]  [facility]   Medication assistance [phone]  [facility]   Primary medical care  Accepts Medicare, [place], insurance and self-pay [phone] [address] [place], [place] [address]  [facility]   Primary medical care  Accepts Medicare, [place], insurance and self-pay [phone] [address] [place], [place] [address]  [facility]  For [place] residents between the ages of 78 and 46 who do not have any form of health insurance, Medicare, [place], or [place] benefits.  Services are provided free of charge to uninsured patients who fall within federal poverty guidelines.    Hours: Tuesdays and Thursdays, 4:15 -  8 pm [phone] [address], [address] [place], [place] [address]  [facility]     Primary medical care  Dental care  Nutritional counseling  Pharmacy  Accepts Medicaid, Medicare, most insurance.  Fees are adjusted based on ability to pay.   [phone] [facility] [address] [place], [place]  [phone] [facility] [address] [place], [place]  [phone] [facility] [place], [place]  [phone] [facility], [address] [place], [place]  [phone] 1597 [facility] [address] [place], [place]  [facility]  Womens health and family planning [phone] [place], [place]  [facility] of Social Services  Child care  Emergency assistance for housing and [employer]ark[employer]ilities  Food stamps  Medicaid [phone] [address], [place], [place] [address]   [facility]    Ages 6518 and older  Hours: Mondays and Thursdays, 7:00 am - 9:00 am Patients are seen on a first come, first served basis. [phone], [phone] [place], [place]  [facility] of Social Services  Child care  Emergency assistance for housing and [employer]ark[employer]ilities  Food stamps  Medicaid [phone] [address] [place], [place] [address]  [facility] [employer]  Medication assistance  Rental assistance  Food pantry  Medication assistance  Housing assistance  Emergency food distribution  Utility assistance [phone] [address] [place], [place]  [phone]  [address] [place], [place] [address] Hours: Tuesdays and Thursdays from 9am - 12 noon by appointment only  [phone] [address] [place], [place] [address]  [facility] - Lanae Boastlara F. Gunn   Accepts private insurance, [place]are, and [place]icaid.  Payment is based on a sliding scale for those without insurance.  Hours: Mondays, Tuesdays and Thursdays, 8:30 am - 5:30 pm.   [phone] [address] , [place]  [facility] - Family [facility] at [facility]    Accepts private insurance, [place], and [place].  Payment is based on a sliding scale for those without insurance. [phone] [address] [place], [place]  [facility] - Pediatrics at [address] Scientist, research (physical sciences)[place]  Accepts private insurance, [employer]icare, and [place]icaid.  Payment is based on a sliding scale for those without insurance [phone] [address], [place], [place]  [facility] - [facility] at [facility]  Accepts private insurance, [place]are, and [place].  Payment is based on a sliding scale for those without insurance. [phone] [address] [place], [place]  [facility] - [facility] at [facility]  Accepts private insurance, [place], and [place].  Payment is based on a sliding scale for those without insurance. [phone], [phone]. [phone] [place], [place].    [facility]  Maternity care.  Accepts Medicaid and self-pay. [phone] [address] [place], [place]

## 2019-03-06 ENCOUNTER — Other Ambulatory Visit: Payer: Self-pay

## 2019-03-06 ENCOUNTER — Emergency Department (HOSPITAL_COMMUNITY)
Admission: EM | Admit: 2019-03-06 | Discharge: 2019-03-06 | Disposition: A | Discharge status: Home / Self Care | Payer: Self-pay | Attending: Emergency Medicine | Admitting: Emergency Medicine

## 2019-03-06 ENCOUNTER — Encounter (HOSPITAL_COMMUNITY): Payer: Self-pay

## 2019-03-06 DIAGNOSIS — M791 Myalgia, unspecified site: Secondary | ICD-10-CM | POA: Insufficient documentation

## 2019-03-06 DIAGNOSIS — J111 Influenza due to unidentified influenza virus with other respiratory manifestations: Secondary | ICD-10-CM

## 2019-03-06 DIAGNOSIS — Z20828 Contact with and (suspected) exposure to other viral communicable diseases: Secondary | ICD-10-CM | POA: Insufficient documentation

## 2019-03-06 DIAGNOSIS — Z20822 Contact with and (suspected) exposure to covid-19: Secondary | ICD-10-CM

## 2019-03-06 DIAGNOSIS — R6883 Chills (without fever): Secondary | ICD-10-CM | POA: Insufficient documentation

## 2019-03-06 LAB — INFLUENZA PANEL BY PCR (TYPE A & B)
Influenza A By PCR: NEGATIVE
Influenza B By PCR: NEGATIVE

## 2019-03-06 NOTE — ED Provider Notes (Signed)
[facility]-EMERGENCY DEPT Provider Note   CSN: [id number] Arrival date & time: 03/06/19  2022     History Chief Complaint  Patient presents with  . Generalized Body Aches  . loss of taste    Rodney Mclaughlin is a 38 y.o. male who presents to ED for 2-day history of generalized body aches, loss of taste, chills.  Patient is requesting Covid testing.  Denies any known COVID-19 exposures but states that "maybe because some people don't even know they have it."  States that his mother was diagnosed with COVID-19 but he was not around her during her exposure.  He has not taken any medications to help with his symptoms.  Denies any chest pain, abdominal pain, shortness of breath, cough, vomiting.  HPI     No past medical history on file.  There are no problems to display for this patient.   No past surgical history on file.     No family history on file.  Social History   Tobacco Use  . Smoking status: Never Smoker  Substance Use Topics  . Alcohol use: Yes  . Drug use: No    Home Medications Prior to Admission medications   Not on File    Allergies    Patient has no known allergies.  Review of Systems   Review of Systems  Constitutional: Positive for chills. Negative for fever.  HENT:       +loss of taste  Respiratory: Negative for shortness of breath.   Musculoskeletal: Positive for myalgias.  Neurological: Negative for weakness and numbness.    Physical Exam Updated Vital Signs BP 121/83   Pulse 78   Temp 99.3 F (37.4 C) (Oral)   Resp 18   SpO2 98%   Physical Exam Vitals and nursing note reviewed.  Constitutional:      General: He is not in acute distress.    Appearance: He is well-developed. He is not diaphoretic.  HENT:     Head: Normocephalic and atraumatic.  Eyes:     General: No scleral icterus.    Conjunctiva/sclera: Conjunctivae normal.  Pulmonary:     Effort: Pulmonary effort is normal. No respiratory distress.    Musculoskeletal:     Cervical back: Normal range of motion.  Skin:    Findings: No rash.  Neurological:     Mental Status: He is alert.     ED Results / Procedures / Treatments   Labs (all labs ordered are listed, but only abnormal results are displayed) Labs Reviewed  NOVEL CORONAVIRUS, NAA (HOSP ORDER, SEND-OUT TO REF LAB; TAT 18-24 HRS)  INFLUENZA PANEL BY PCR (TYPE A & B)    EKG None  Radiology No results found.  Procedures Procedures (including critical care time)  Medications Ordered in ED Medications - No data to display  ED Course  I have reviewed the triage vital signs and the nursing notes.  Pertinent labs & imaging results that were available during my care of the patient were reviewed by me and considered in my medical decision making (see chart for details).    MDM Rules/Calculators/A&P                      Rodney Mclaughlin was evaluated in Emergency Department on 03/06/19  for the symptoms described in the history of present illness. He/she was evaluated in the context of the global COVID-19 pandemic, which necessitated consideration that the patient might be at risk for infection with the  SARS-CoV-2 virus that causes COVID-19. Institutional protocols and algorithms that pertain to the evaluation of patients at risk for COVID-19 are in a state of rapid change based on information released by regulatory bodies including the CDC and federal and state organizations. These policies and algorithms were followed during the patient's care in the ED.  38 year old male presents to ED for chills, body aches and loss of taste for the past 2 days.  He is requesting a Covid test.  Denies any cough, chest pain, shortness of breath.  He is afebrile here with no recent use of antipyretics.  Will obtain Covid swab as well as influenza test.  Patient will follow up with results and we will have him continue Tylenol and ibuprofen as needed for pain.  Patient is hemodynamically  stable, in NAD, and able to ambulate in the ED. Evaluation does not show pathology that would require ongoing emergent intervention or inpatient treatment. I explained the diagnosis to the patient. Pain has been managed and has no complaints prior to discharge. Patient is comfortable with above plan and is stable for discharge at this time. All questions were answered prior to disposition. Strict return precautions for returning to the ED were discussed. Encouraged follow up with PCP.   An After Visit Summary was printed and given to the patient.   Portions of this note were generated with [employer]. Dictation errors may occur despite best attempts at proofreading.   Final Clinical Impression(s) / ED Diagnoses Final diagnoses:  Influenza-like illness  Suspected COVID-19 virus infection    Rx / DC Orders ED Discharge Orders    None       Delia Heady, PA-C 03/06/19 2347    Sherwood Gambler, MD 03/10/19 [phone]

## 2019-03-06 NOTE — Discharge Instructions (Addendum)
We will contact you with the results of your remaining lab work when it is available. In the meantime you can take Tylenol, ibuprofen and follow instructions regarding quarantine.

## 2019-03-06 NOTE — ED Triage Notes (Signed)
Pt coming in c/o generalized body aches, chills, loss of taste x2 days.

## 2019-03-08 LAB — NOVEL CORONAVIRUS, NAA (HOSP ORDER, SEND-OUT TO REF LAB; TAT 18-24 HRS): SARS-CoV-2, NAA: NOT DETECTED

## 2019-09-28 ENCOUNTER — Emergency Department (HOSPITAL_COMMUNITY): Admission: EM | Admit: 2019-09-28 | Discharge: 2019-09-28 | Discharge status: Against Medical Advice | Payer: Self-pay

## 2019-09-28 ENCOUNTER — Other Ambulatory Visit: Payer: Self-pay

## 2019-09-28 ENCOUNTER — Encounter (HOSPITAL_COMMUNITY): Payer: Self-pay

## 2019-09-28 ENCOUNTER — Emergency Department (HOSPITAL_COMMUNITY)
Admission: EM | Admit: 2019-09-28 | Discharge: 2019-09-28 | Disposition: A | Discharge status: Home / Self Care | Payer: Self-pay | Attending: Emergency Medicine | Admitting: Emergency Medicine

## 2019-09-28 DIAGNOSIS — R1031 Right lower quadrant pain: Secondary | ICD-10-CM | POA: Insufficient documentation

## 2019-09-28 DIAGNOSIS — Z5321 Procedure and treatment not carried out due to patient leaving prior to being seen by health care provider: Secondary | ICD-10-CM | POA: Insufficient documentation

## 2019-09-28 DIAGNOSIS — R1110 Vomiting, unspecified: Secondary | ICD-10-CM | POA: Insufficient documentation

## 2019-09-28 LAB — URINALYSIS, ROUTINE W REFLEX MICROSCOPIC
Bacteria, UA: NONE SEEN
Glucose, UA: NEGATIVE mg/dL
Hgb urine dipstick: NEGATIVE
Ketones, ur: 5 mg/dL — AB
Nitrite: NEGATIVE
Protein, ur: 30 mg/dL — AB
Specific Gravity, Urine: 1.041 — ABNORMAL HIGH (ref 1.005–1.030)
pH: 5 (ref 5.0–8.0)

## 2019-09-28 LAB — CBC
HCT: 47.5 % (ref 39.0–52.0)
Hemoglobin: 15.2 g/dL (ref 13.0–17.0)
MCH: 31.5 pg (ref 26.0–34.0)
MCHC: 32 g/dL (ref 30.0–36.0)
MCV: 98.3 fL (ref 80.0–100.0)
Platelets: 187 10*3/uL (ref 150–400)
RBC: 4.83 MIL/uL (ref 4.22–5.81)
RDW: 13.1 % (ref 11.5–15.5)
WBC: 8.1 10*3/uL (ref 4.0–10.5)
nRBC: 0 % (ref 0.0–0.2)

## 2019-09-28 LAB — COMPREHENSIVE METABOLIC PANEL
ALT: 30 U/L (ref 0–44)
AST: 24 U/L (ref 15–41)
Albumin: 4.6 g/dL (ref 3.5–5.0)
Alkaline Phosphatase: 44 U/L (ref 38–126)
Anion gap: 9 (ref 5–15)
BUN: 9 mg/dL (ref 6–20)
CO2: 26 mmol/L (ref 22–32)
Calcium: 9.2 mg/dL (ref 8.9–10.3)
Chloride: 105 mmol/L (ref 98–111)
Creatinine, Ser: 0.95 mg/dL (ref 0.61–1.24)
GFR calc Af Amer: >60 mL/min (ref >60)
GFR calc non Af Amer: >60 mL/min (ref >60)
Glucose, Bld: 105 mg/dL — ABNORMAL HIGH (ref 70–99)
Potassium: 3.7 mmol/L (ref 3.5–5.1)
Sodium: 140 mmol/L (ref 135–145)
Total Bilirubin: 0.5 mg/dL (ref 0.3–1.2)
Total Protein: 7.1 g/dL (ref 6.5–8.1)

## 2019-09-28 LAB — LIPASE, BLOOD: Lipase: 28 U/L (ref 11–51)

## 2019-09-28 NOTE — ED Notes (Signed)
Patient called x3 for triage with no answer. 

## 2019-09-28 NOTE — ED Triage Notes (Signed)
Pt sts RLQ abdominal pain and vomiting since yesterday.

## 2019-09-28 NOTE — ED Notes (Signed)
Called in lobby and outside no response

## 2019-09-30 ENCOUNTER — Other Ambulatory Visit: Payer: Self-pay

## 2019-09-30 ENCOUNTER — Emergency Department (HOSPITAL_COMMUNITY)
Admission: EM | Admit: 2019-09-30 | Discharge: 2019-10-01 | Disposition: A | Discharge status: Home / Self Care | Payer: Self-pay | Attending: Emergency Medicine | Admitting: Emergency Medicine

## 2019-09-30 ENCOUNTER — Encounter (HOSPITAL_COMMUNITY): Payer: Self-pay | Admitting: Emergency Medicine

## 2019-09-30 DIAGNOSIS — R112 Nausea with vomiting, unspecified: Secondary | ICD-10-CM | POA: Insufficient documentation

## 2019-09-30 DIAGNOSIS — R1011 Right upper quadrant pain: Secondary | ICD-10-CM | POA: Insufficient documentation

## 2019-09-30 MED ORDER — SODIUM CHLORIDE 0.9% FLUSH: 3.0000 mL | Freq: Once | INTRAVENOUS | Status: DC

## 2019-09-30 NOTE — ED Triage Notes (Signed)
Patient reports right abdominal pain with emesis this week , no diarrhea or fever .

## 2019-10-01 ENCOUNTER — Emergency Department (HOSPITAL_COMMUNITY): Payer: Self-pay

## 2019-10-01 LAB — URINALYSIS, ROUTINE W REFLEX MICROSCOPIC
Bilirubin Urine: NEGATIVE
Glucose, UA: NEGATIVE mg/dL
Hgb urine dipstick: NEGATIVE
Ketones, ur: NEGATIVE mg/dL
Leukocytes,Ua: NEGATIVE
Nitrite: NEGATIVE
Protein, ur: NEGATIVE mg/dL
Specific Gravity, Urine: 1.021 (ref 1.005–1.030)
pH: 7 (ref 5.0–8.0)

## 2019-10-01 LAB — COMPREHENSIVE METABOLIC PANEL
ALT: 23 U/L (ref 0–44)
AST: 17 U/L (ref 15–41)
Albumin: 4 g/dL (ref 3.5–5.0)
Alkaline Phosphatase: 51 U/L (ref 38–126)
Anion gap: 11 (ref 5–15)
BUN: 8 mg/dL (ref 6–20)
CO2: 25 mmol/L (ref 22–32)
Calcium: 9.7 mg/dL (ref 8.9–10.3)
Chloride: 108 mmol/L (ref 98–111)
Creatinine, Ser: 1.04 mg/dL (ref 0.61–1.24)
GFR calc Af Amer: >60 mL/min (ref >60)
GFR calc non Af Amer: >60 mL/min (ref >60)
Glucose, Bld: 101 mg/dL — ABNORMAL HIGH (ref 70–99)
Potassium: 4.4 mmol/L (ref 3.5–5.1)
Sodium: 144 mmol/L (ref 135–145)
Total Bilirubin: 0.9 mg/dL (ref 0.3–1.2)
Total Protein: 6.6 g/dL (ref 6.5–8.1)

## 2019-10-01 LAB — CBC
HCT: 48.2 % (ref 39.0–52.0)
Hemoglobin: 15.6 g/dL (ref 13.0–17.0)
MCH: 31.3 pg (ref 26.0–34.0)
MCHC: 32.4 g/dL (ref 30.0–36.0)
MCV: 96.8 fL (ref 80.0–100.0)
Platelets: 205 10*3/uL (ref 150–400)
RBC: 4.98 MIL/uL (ref 4.22–5.81)
RDW: 13 % (ref 11.5–15.5)
WBC: 7.9 10*3/uL (ref 4.0–10.5)
nRBC: 0 % (ref 0.0–0.2)

## 2019-10-01 LAB — LIPASE, BLOOD: Lipase: 23 U/L (ref 11–51)

## 2019-10-01 MED ORDER — IOHEXOL 300 MG/ML  SOLN
100.0000 mL | Freq: Once | INTRAMUSCULAR | Status: AC | PRN
  Administered 2019-10-01: 100 mL via INTRAVENOUS

## 2019-10-01 MED ORDER — ONDANSETRON HCL 4 MG/2ML IJ SOLN
4.0000 mg | Freq: Once | INTRAMUSCULAR | Status: AC
  Administered 2019-10-01: 4 mg via INTRAVENOUS
  Filled 2019-10-01: qty 2

## 2019-10-01 MED ORDER — ONDANSETRON 4 MG PO TBDP
ORAL_TABLET | ORAL | 0 refills | Status: AC
Start: 2019-10-01 — End: ?

## 2019-10-01 MED ORDER — SODIUM CHLORIDE 0.9 % IV BOLUS
1000.0000 mL | Freq: Once | INTRAVENOUS | Status: AC
  Administered 2019-10-01: 1000 mL via INTRAVENOUS

## 2019-10-01 MED ORDER — FAMOTIDINE 20 MG PO TABS: 20.0000 mg | ORAL_TABLET | Freq: Two times a day (BID) | ORAL | 0 refills | Status: AC

## 2019-10-01 MED ORDER — MORPHINE SULFATE (PF) 4 MG/ML IV SOLN
4.0000 mg | Freq: Once | INTRAVENOUS | Status: AC
  Administered 2019-10-01: 4 mg via INTRAVENOUS
  Filled 2019-10-01: qty 1

## 2019-10-01 NOTE — ED Provider Notes (Signed)
[facility] Provider Note   CSN: [id number] Arrival date & time: 09/30/19  2309     History Chief Complaint  Patient presents with  . Abdominal Pain    Rodney Mclaughlin is a 39 y.o. male.  Rodney Mclaughlin is a 39 y.o. male who is otherwise healthy, presents to the emergency department for evaluation of right-sided upper abdominal pain.  He states that pain started on Tuesday and was initially a dull pain that he did not think much of it, but then started to develop nausea and vomiting.  He reports pain and vomiting are worse after eating.  He reports pretty consistently about 20 minutes after he tries to eat a meal he starts having worsening right-sided abdominal pain and then becomes nauseated and has an episode of emesis.  He has not had any diarrhea, constipation, dark black stools or blood in his stools.  He denies any fevers or chills.  No associated chest pain or shortness of breath.  No dysuria or urinary frequency, no hematuria.  No prior history of kidney stones, no previous abdominal surgeries.  He has not taken any medicine to treat symptoms prior to arrival.  No other aggravating or alleviating factors.        History reviewed. No pertinent past medical history.  There are no problems to display for this patient.   History reviewed. No pertinent surgical history.     No family history on file.  Social History   Tobacco Use  . Smoking status: Never Smoker  . Smokeless tobacco: Never Used  Substance Use Topics  . Alcohol use: Yes  . Drug use: No    Home Medications Prior to Admission medications   Medication Sig Start Date End Date Taking? Authorizing Provider  acetaminophen (TYLENOL) 325 MG tablet Take 650 mg by mouth every 6 (six) hours as needed for mild pain, fever or headache.   Yes [provider]  famotidine (PEPCID) 20 MG tablet Take 1 tablet (20 mg total) by mouth 2 (two) times daily. 10/01/19   Dartha LodgeFord, Tyrease Vandeberg N,  PA-C  ondansetron (ZOFRAN ODT) 4 MG disintegrating tablet 4mg  ODT q4 hours prn nausea/vomit 10/01/19   Dartha LodgeFord, Ronit Cranfield N, PA-C    Allergies    Patient has no known allergies.  Review of Systems   Review of Systems  Constitutional: Negative for chills and fever.  HENT: Negative.   Respiratory: Negative for cough and shortness of breath.   Cardiovascular: Negative for chest pain.  Gastrointestinal: Positive for abdominal pain, nausea and vomiting. Negative for constipation and diarrhea.  Genitourinary: Negative for dysuria, flank pain, frequency and hematuria.  Musculoskeletal: Negative for arthralgias and myalgias.  Skin: Negative for color change and rash.  Neurological: Negative for dizziness, syncope and light-headedness.  All other systems reviewed and are negative.   Physical Exam Updated Vital Signs BP 113/79   Pulse 56   Temp 98.6 F (37 C) (Oral)   Resp (!) 26   Ht 5\' 8"  (1.727 m)   Wt 75 kg   SpO2 100%   BMI 25.14 kg/m   Physical Exam Vitals and nursing note reviewed.  Constitutional:      General: He is not in acute distress.    Appearance: He is well-developed and normal weight. He is not ill-appearing or diaphoretic.     Comments: Well-appearing and in no distress  HENT:     Head: Normocephalic and atraumatic.  Eyes:     General:  Right eye: No discharge.        Left eye: No discharge.     Pupils: Pupils are equal, round, and reactive to light.  Cardiovascular:     Rate and Rhythm: Normal rate and regular rhythm.     Heart sounds: Normal heart sounds. No murmur heard.  No friction rub. No gallop.   Pulmonary:     Effort: Pulmonary effort is normal. No respiratory distress.     Breath sounds: Normal breath sounds. No wheezing or rales.     Comments: Respirations equal and unlabored, patient able to speak in full sentences, lungs clear to auscultation bilaterally Abdominal:     General: Bowel sounds are normal. There is no distension.      Palpations: Abdomen is soft. There is no mass.     Tenderness: There is abdominal tenderness in the right upper quadrant. There is no right CVA tenderness, left CVA tenderness or guarding.     Comments: Abdomen is soft, nondistended, bowel sounds present throughout, there is focal right upper quadrant abdominal tenderness without guarding, all other quadrants nontender to palpation, no CVA tenderness bilaterally  Musculoskeletal:        General: No deformity.     Cervical back: Neck supple.  Skin:    General: Skin is warm and dry.     Capillary Refill: Capillary refill takes less than 2 seconds.  Neurological:     Mental Status: He is alert.     Coordination: Coordination normal.     Comments: Speech is clear, able to follow commands Moves extremities without ataxia, coordination intact  Psychiatric:        Mood and Affect: Mood normal.        Behavior: Behavior normal.     ED Results / Procedures / Treatments   Labs (all labs ordered are listed, but only abnormal results are displayed) Labs Reviewed  COMPREHENSIVE METABOLIC PANEL - Abnormal; Notable for the following components:      Result Value   Glucose, Bld 101 (*)    All other components within normal limits  LIPASE, BLOOD  CBC  URINALYSIS, ROUTINE W REFLEX MICROSCOPIC    EKG None  Radiology CT ABDOMEN PELVIS W CONTRAST  Result Date: 10/01/2019 CLINICAL DATA:  Acute abdominal pain worse after meals with vomiting 4 days. Pain worse right-sided. Normal right upper quadrant ultrasound. EXAM: CT ABDOMEN AND PELVIS WITH CONTRAST TECHNIQUE: Multidetector CT imaging of the abdomen and pelvis was performed using the standard protocol following bolus administration of intravenous contrast. CONTRAST:  OMNIPAQUE IOHEXOL 300 MG/ML  SOLN COMPARISON:  Right upper quadrant ultrasound 10/01/2019 FINDINGS: Lower chest: Lung bases are clear. Hepatobiliary: Liver, gallbladder and biliary tree are normal. Pancreas: Normal. Spleen:  Normal. Adrenals/Urinary Tract: Adrenal glands are normal. Kidneys are normal size, shape and position without focal mass or nephrolithiasis. No hydronephrosis. Ureters and bladder are normal. Stomach/Bowel: Stomach is normal. Few mildly prominent jejunal loops in the left upper quadrant. Appendix is normal. Colon is normal. Vascular/Lymphatic: Abdominal aorta is normal in caliber. No evidence of adenopathy. Reproductive: Normal. Other: No free fluid or focal inflammatory change. No free peritoneal air. Musculoskeletal: Normal. IMPRESSION: 1. Few minimally prominent jejunal loops in the left upper quadrant likely within normal, although early/partial obstructive process or focal ileus is possible as serial follow-up abdominal films may be helpful. 2.  No acute process in the right lower quadrant.  Normal appendix. Electronically Signed   By: Elberta Fortis M.D.   On: 10/01/2019 11:47  US Abdomen Limited RUQ  Result Date: 10/01/2019 CLINICAL DATA:  Right upper quadrant pain and vomiting 4 days. EXAM: ULTRASOUND ABDOMEN LIMITED RIGHT UPPER QUADRANT COMPARISON:  None. FINDINGS: Gallbladder: No gallstones or wall thickening visualized. No sonographic Murphy sign noted by sonographer. Common bile duct: Diameter: 3.7 mm. Liver: No focal lesion identified. Within normal limits in parenchymal echogenicity. Portal vein is patent on color Doppler imaging with normal direction of blood flow towards the liver. Other: None. IMPRESSION: No acute findings. Electronically Signed   By: Elberta Fortis M.D.   On: 10/01/2019 10:42    Procedures Procedures (including critical care time)  Medications Ordered in ED Medications  sodium chloride flush (NS) 0.9 % injection 3 mL (has no administration in time range)  sodium chloride 0.9 % bolus 1,000 mL (1,000 mLs Intravenous New Bag/Given 10/01/19 0851)  ondansetron (ZOFRAN) injection 4 mg (4 mg Intravenous Given 10/01/19 0854)  morphine 4 MG/ML injection 4 mg (4 mg Intravenous  Given 10/01/19 0856)  iohexol (OMNIPAQUE) 300 MG/ML solution 100 mL (100 mLs Intravenous Contrast Given 10/01/19 1114)    ED Course  I have reviewed the triage vital signs and the nursing notes.  Pertinent labs & imaging results that were available during my care of the patient were reviewed by me and considered in my medical decision making (see chart for details).    MDM Rules/Calculators/A&P                          Patient presents to the ED with complaints of abdominal pain. Patient nontoxic appearing, in no apparent distress, vitals WNL . On exam patient tender to palpation in the right upper quadrant, no peritoneal signs. Will evaluate with labs and right upper quadrant ultrasound, if unremarkable may need CT. Analgesics, anti-emetics, and fluids administered.   I have independently ordered, reviewed and interpreted all labs and imaging: CBC: No leukocytosis, normal hemoglobin CMP: Glucose of 101, no other electrolyte derangements, normal renal and liver function Lipase: WNL UA: No hematuria or signs of infection  Imaging: RUQ [place]: No acute findings or evidence of cholecystitis  Given reassuring ultrasound will proceed with CT abdomen pelvis to assess for potential kidney stone, appendicitis, or other acute intra-abdominal pathology.  On reassessment pain has improved with treatment in the ED.  CT: Few minimally prominent jejunal loops in the left upper quadrant likely within normal limits but could be early or partial bowel obstruction versus focal ileus, but this does not seem consistent with patient's symptoms and he has no pain in this area, pain is more so postprandial and associated with vomiting and right upper quadrant tenderness.  Normal appendix noted on CT, no evidence of renal stone or hydro-.  On repeat abdominal exam patient remains without peritoneal signs, no evidence of cholecystitis, pancreatitis, diverticulitis, appendicitis, bowel obstruction/perforation. Patient  tolerating PO in the emergency department. Will discharge home with supportive measures.  Will start patient on PPI, sent with prescription for Zofran as needed for nausea and vomiting.  Encouraged him to maintain a low-fat and low acid diet to help with potential gallbladder dysfunction versus GERD.  I discussed results, treatment plan, need for PCP and GI follow-up, and return precautions with the patient. Provided opportunity for questions, patient confirmed understanding and is in agreement with plan.    Final Clinical Impression(s) / ED Diagnoses Final diagnoses:  RUQ abdominal pain    Rx / DC Orders ED Discharge Orders  Ordered    famotidine (PEPCID) 20 MG tablet  2 times daily     Discontinue  Reprint     10/01/19 1244    ondansetron (ZOFRAN ODT) 4 MG disintegrating tablet     Discontinue  Reprint     10/01/19 1244           Dartha Lodge, [place] 10/01/19 1256    Little, Ambrose Finland, MD 10/01/19 [phone]

## 2019-10-01 NOTE — ED Notes (Signed)
Patient transported to Ultrasound 

## 2019-10-01 NOTE — Discharge Instructions (Signed)
Your work-up today has been very reassuring your lab work looks good, your ultrasound did not show any gallstones and your CT scan look good aside from some possible inflammation of some of your small intestines, which does not seem consistent with the symptoms you have been experiencing.  I suspect this is likely due to acid reflux or irritation of your stomach versus gallbladder problems.  Please eat a low-fat and low acid diet.  Begin taking Pepcid twice daily before breakfast and dinner, use prescribed Zofran as needed for nausea and vomiting, ibuprofen and/or Tylenol as needed for pain.  Please call to schedule follow-up with your primary care doctor and a GI doctor for further evaluation if symptoms or not improving you may need a scan to check the function of your gallbladder.  If you develop any new or worsening abdominal pain, fevers, persistent vomiting or any other new or concerning symptoms return to the emergency department.

## 2019-10-01 NOTE — ED Notes (Signed)
Patient verbalizes understanding of discharge instructions. Opportunity for questioning and answers were provided. Armband removed by staff, pt discharged from ED.  

## 2021-02-22 ENCOUNTER — Encounter (HOSPITAL_COMMUNITY): Payer: Self-pay

## 2021-02-22 ENCOUNTER — Other Ambulatory Visit: Payer: Self-pay

## 2021-02-22 ENCOUNTER — Emergency Department (HOSPITAL_COMMUNITY)
Admission: EM | Admit: 2021-02-22 | Discharge: 2021-02-22 | Disposition: A | Discharge status: Home / Self Care | Payer: Self-pay | Attending: Emergency Medicine | Admitting: Emergency Medicine

## 2021-02-22 DIAGNOSIS — U071 COVID-19: Secondary | ICD-10-CM | POA: Insufficient documentation

## 2021-02-22 LAB — RESP PANEL BY RT-PCR (FLU A&B, COVID) ARPGX2
Influenza A by PCR: NEGATIVE
Influenza B by PCR: NEGATIVE
SARS Coronavirus 2 by RT PCR: POSITIVE — AB

## 2021-02-22 MED ORDER — ACETAMINOPHEN 325 MG PO TABS
650.0000 mg | ORAL_TABLET | Freq: Once | ORAL | Status: AC
  Administered 2021-02-22: 650 mg via ORAL
  Filled 2021-02-22: qty 2

## 2021-02-22 NOTE — ED Triage Notes (Signed)
Patient c/o chills, body aches, and a headache x 3 days.

## 2021-02-22 NOTE — ED Provider Notes (Addendum)
[facility]-EMERGENCY DEPT Provider Note   CSN: [id number] Arrival date & time: 02/22/21  1208     History Chief Complaint  Patient presents with   Generalized Body Aches   Headache   Chills    Rodney Mclaughlin is a 40 y.o. male with no significant past medical history presents to the ED complaining of myalgias x3 days.  Sick contacts at work positive for COVID.  Patient has associated chills and headache.  He has not tried any medications for his symptoms.  He denies fever, chills, chest pain, shortness of breath, abdominal pain, nausea, vomiting.  The history is provided by the patient. No language interpreter was used.      No past medical history on file.  There are no problems to display for this patient.   History reviewed. No pertinent surgical history.     History reviewed. No pertinent family history.  Social History   Tobacco Use   Smoking status: Never   Smokeless tobacco: Never  Vaping Use   Vaping Use: Every day   Substances: Nicotine, Flavoring  Substance Use Topics   Alcohol use: Yes   Drug use: No    Home Medications Prior to Admission medications   Medication Sig Start Date End Date Taking? Authorizing Provider  acetaminophen (TYLENOL) 325 MG tablet Take 650 mg by mouth every 6 (six) hours as needed for mild pain, fever or headache.    [provider]  famotidine (PEPCID) 20 MG tablet Take 1 tablet (20 mg total) by mouth 2 (two) times daily. 10/01/19   Dartha Lodge, PA-C  ondansetron (ZOFRAN ODT) 4 MG disintegrating tablet 4mg  ODT q4 hours prn nausea/vomit 10/01/19   10/03/19, PA-C    Allergies    Patient has no known allergies.  Review of Systems   Review of Systems  Constitutional:  Positive for chills. Negative for fever.  Respiratory:  Negative for shortness of breath.   Cardiovascular:  Negative for chest pain.  Gastrointestinal:  Negative for abdominal pain, nausea and vomiting.  Musculoskeletal:   Positive for myalgias. Negative for arthralgias.  Skin:  Negative for color change, rash and wound.  Neurological:  Positive for headaches.  All other systems reviewed and are negative.  Physical Exam Updated Vital Signs BP 101/61 (BP Location: Left Arm)    Pulse 85    Temp 98.3 F (36.8 C) (Oral)    Resp 16    Ht 5\' 8"  (1.727 m)    Wt 68 kg    SpO2 98%    BMI 22.81 kg/m   Physical Exam Vitals and nursing note reviewed.  Constitutional:      General: He is not in acute distress.    Appearance: He is not diaphoretic.  HENT:     Head: Normocephalic and atraumatic.     Mouth/Throat:     Pharynx: No oropharyngeal exudate.  Eyes:     General: No scleral icterus.    Conjunctiva/sclera: Conjunctivae normal.  Cardiovascular:     Rate and Rhythm: Normal rate and regular rhythm.     Pulses: Normal pulses.     Heart sounds: Normal heart sounds.  Pulmonary:     Effort: Pulmonary effort is normal. No respiratory distress.     Breath sounds: Normal breath sounds. No wheezing.  Abdominal:     General: Bowel sounds are normal.     Palpations: Abdomen is soft. There is no mass.     Tenderness: There is no abdominal tenderness.  There is no guarding or rebound.  Musculoskeletal:        General: Normal range of motion.     Cervical back: Normal range of motion and neck supple.  Skin:    General: Skin is warm and dry.  Neurological:     Mental Status: He is alert.  Psychiatric:        Behavior: Behavior normal.    ED Results / Procedures / Treatments   Labs (all labs ordered are listed, but only abnormal results are displayed) Labs Reviewed  RESP PANEL BY RT-PCR (FLU A&B, COVID) ARPGX2 - Abnormal; Notable for the following components:      Result Value   SARS Coronavirus 2 by RT PCR POSITIVE (*)    All other components within normal limits    EKG None  Radiology No results found.  Procedures Procedures   Medications Ordered in ED Medications  acetaminophen (TYLENOL)  tablet 650 mg (650 mg Oral Given 02/22/21 1404)    ED Course  I have reviewed the triage vital signs and the nursing notes.  Pertinent labs & imaging results that were available during my care of the patient were reviewed by me and considered in my medical decision making (see chart for details).    MDM Rules/Calculators/A&P                         Patient with myalgias, chills, headache x3 days. Sick contacts at home/school.  Vital signs stable, patient afebrile, oxygen saturation 98%. Differential diagnosis includes COVID, Flu, or viral URI with cough. On exam patient without acute cardiovascular, pulmonary, abdominal exam without acute findings. Swabbed for COVID, flu.  Results showed positive for COVID-19. Headache improved with tylenol in the ED. Patient is otherwise healthy and not immunocompromised. Repeat vital signs show patient afebrile and O2 sats at 98%. Pt stable with patent airway, no concern for airway compromise.  Discussed with patient CDC isolation guidelines, patient aware that isolation period ends 02/24/21. Discussed with patient supportive care measures.  Patient agreeable at this time.  Work note provided.  Strict return precautions given.  Patient acknowledges and verbalizes understanding.  Patient appears safe for discharge at this time.  Follow-up as indicated in discharge paperwork.   Final Clinical Impression(s) / ED Diagnoses Final diagnoses:  COVID-19    Rx / DC Orders ED Discharge Orders     None        Markos Theil A, PA-C 02/22/21 1538    Marija Calamari A, PA-C 02/22/21 1540    Sloan Leiter, DO 02/22/21 1636

## 2021-02-22 NOTE — Discharge Instructions (Addendum)
Your swab was positive for COVID.  You may take over-the-counter 600 mg ibuprofen every 6 hours or 1,000 mg Tylenol every 6 hours as needed for body aches or fever.  You are to be in isolation until Sunday, 02/24/2021.  Ensure that you wear your mask.  You may follow-up with your primary care provider as needed.  Return to the ED if you are experiencing increasing/worsening chest pain, decreased fluid intake, trouble breathing, new or worsening symptoms.

## 2021-04-05 ENCOUNTER — Other Ambulatory Visit: Payer: Self-pay

## 2021-04-05 ENCOUNTER — Emergency Department (HOSPITAL_COMMUNITY): Payer: No Typology Code available for payment source

## 2021-04-05 ENCOUNTER — Encounter (HOSPITAL_COMMUNITY): Payer: Self-pay

## 2021-04-05 ENCOUNTER — Emergency Department (HOSPITAL_COMMUNITY)
Admission: EM | Admit: 2021-04-05 | Discharge: 2021-04-05 | Disposition: A | Discharge status: Home / Self Care | Payer: No Typology Code available for payment source | Attending: Emergency Medicine | Admitting: Emergency Medicine

## 2021-04-05 DIAGNOSIS — S3992XA Unspecified injury of lower back, initial encounter: Secondary | ICD-10-CM | POA: Diagnosis present

## 2021-04-05 DIAGNOSIS — X500XXA Overexertion from strenuous movement or load, initial encounter: Secondary | ICD-10-CM | POA: Diagnosis not present

## 2021-04-05 DIAGNOSIS — S39012A Strain of muscle, fascia and tendon of lower back, initial encounter: Secondary | ICD-10-CM | POA: Diagnosis not present

## 2021-04-05 MED ORDER — METHOCARBAMOL 500 MG PO TABS: 500.0000 mg | ORAL_TABLET | Freq: Two times a day (BID) | ORAL | 0 refills | Status: AC

## 2021-04-05 MED ORDER — LIDOCAINE 5 % EX PTCH: 1.0000 | MEDICATED_PATCH | CUTANEOUS | 0 refills | Status: AC

## 2021-04-05 MED ORDER — DICLOFENAC SODIUM 50 MG PO TBEC: 50.0000 mg | DELAYED_RELEASE_TABLET | Freq: Two times a day (BID) | ORAL | 0 refills | Status: AC

## 2021-04-05 NOTE — ED Triage Notes (Signed)
Patient c/o right lower back pain x 3 days. Patient states he was moving a refrigerator and missed a step.  Patient denies pain radiating down his right leg.

## 2021-04-05 NOTE — ED Provider Notes (Signed)
[facility]-EMERGENCY DEPT Provider Note   CSN: [id number] Arrival date & time: 04/05/21  0867     History  Chief Complaint  Patient presents with   Back Pain    Rodney Mclaughlin is a 41 y.o. male.  41 year old male presents with complaint of right lower back pain.  Patient states 2 days ago he was helping to move a refrigerator, had straps over his shoulders and was walking backwards when the weight shifted and he ended up with putting a hole weight on his right leg resulting in pain in his low back and right low back.  Pain is sharp and stabbing in nature, worse if he tries to lean over.  Patient works in a Agricultural [employer] and states he is unable to get down on all fours to do his job at this time.  He has not taken anything for his pain, thought that if he just gave it a day or 2 to get better and it is not.  Pain does not radiate.  Denies abdominal pain, groin numbness, loss of bowel or bladder control.      Home Medications Prior to Admission medications   Medication Sig Start Date End Date Taking? Authorizing Provider  diclofenac (VOLTAREN) 50 MG EC tablet Take 1 tablet (50 mg total) by mouth 2 (two) times daily for 10 days. 04/05/21 04/15/21 Yes Jeannie Fend, PA-C  lidocaine (LIDODERM) 5 % Place 1 patch onto the skin daily. Remove & Discard patch within 12 hours or as directed by MD 04/05/21  Yes Jeannie Fend, PA-C  methocarbamol (ROBAXIN) 500 MG tablet Take 1 tablet (500 mg total) by mouth 2 (two) times daily. 04/05/21  Yes Jeannie Fend, PA-C  acetaminophen (TYLENOL) 325 MG tablet Take 650 mg by mouth every 6 (six) hours as needed for mild pain, fever or headache.    [provider]  famotidine (PEPCID) 20 MG tablet Take 1 tablet (20 mg total) by mouth 2 (two) times daily. 10/01/19   Dartha Lodge, PA-C  ondansetron (ZOFRAN ODT) 4 MG disintegrating tablet 4mg  ODT q4 hours prn nausea/vomit 10/01/19   10/03/19, PA-C      Allergies     Patient has no known allergies.    Review of Systems   Review of Systems  Constitutional:  Negative for fever.  Gastrointestinal:  Negative for abdominal pain, constipation and diarrhea.  Genitourinary:  Negative for decreased urine volume.  Musculoskeletal:  Positive for back pain. Negative for gait problem.  Skin:  Negative for rash and wound.  Allergic/Immunologic: Negative for immunocompromised state.  Neurological:  Negative for weakness and numbness.   Physical Exam Updated Vital Signs BP (!) 109/93 (BP Location: Right Arm)    Pulse 77    Temp 98.2 F (36.8 C) (Oral)    Resp 18    Ht 5\' 8"  (1.727 m)    Wt 68 kg    SpO2 100%    BMI 22.81 kg/m  Physical Exam Vitals and nursing note reviewed.  Constitutional:      General: He is not in acute distress.    Appearance: He is well-developed. He is not diaphoretic.  HENT:     Head: Normocephalic and atraumatic.  Cardiovascular:     Pulses: Normal pulses.  Pulmonary:     Effort: Pulmonary effort is normal.  Musculoskeletal:        General: Tenderness present. No swelling or deformity.     Cervical back: No tenderness  or bony tenderness.     Thoracic back: No tenderness or bony tenderness.     Lumbar back: Tenderness and bony tenderness present.       Back:  Skin:    General: Skin is warm and dry.     Findings: No erythema or rash.  Neurological:     Mental Status: He is alert and oriented to person, place, and time.     Sensory: No sensory deficit.     Motor: No weakness.     Gait: Gait normal.     Deep Tendon Reflexes: Reflexes normal.  Psychiatric:        Behavior: Behavior normal.    ED Results / Procedures / Treatments   Labs (all labs ordered are listed, but only abnormal results are displayed) Labs Reviewed - No data to display  EKG None  Radiology DG Lumbar Spine Complete  Result Date: 04/05/2021 CLINICAL DATA:  low back pain, injury EXAM: LUMBAR SPINE - COMPLETE 4+ VIEW COMPARISON:  CT 10/01/2019  FINDINGS: There are 5 non-rib-bearing lumbar vertebrae. There is no evidence of lumbar spine fracture. Normal alignment. Disc heights are preserved. IMPRESSION: Negative lumbar spine radiographs. Electronically Signed   By: Caprice Renshaw M.D.   On: 04/05/2021 09:02    Procedures Procedures    Medications Ordered in ED Medications - No data to display  ED Course/ Medical Decision Making/ A&P                           Medical Decision Making 41 year old male with complaint of midline and right lower back pain as per HPI after moving and refrigerator 2 days ago.  On exam found to have midline tenderness to L4-L5 as well as right paraspinous tenderness.  Gait is normal, reflexes intact, sensation intact.  No red flag symptoms. X-ray is unremarkable.  Plan is to discharge with Lidoderm, diclofenac, Robaxin.  Patient does not currently have a PCP.  Advised to obtain PCP who can refer to PT, also given follow-up with Ortho information.  Problems Addressed: Strain of lumbar region, initial encounter: self-limited or minor problem  Amount and/or Complexity of Data Reviewed Radiology: ordered and independent interpretation performed.    Details: Negative for acute process  Risk Prescription drug management.           Final Clinical Impression(s) / ED Diagnoses Final diagnoses:  Strain of lumbar region, initial encounter    Rx / DC Orders ED Discharge Orders          Ordered    methocarbamol (ROBAXIN) 500 MG tablet  2 times daily        04/05/21 0909    diclofenac (VOLTAREN) 50 MG EC tablet  2 times daily        04/05/21 0909    lidocaine (LIDODERM) 5 %  Every 24 hours        04/05/21 0909              Jeannie Fend, PA-C 04/05/21 1001    Tegeler, Canary Brim, MD 04/05/21 1026

## 2021-04-05 NOTE — Discharge Instructions (Addendum)
Warm compress to low back for 20 minutes to time followed by gentle stretching. Follow-up with orthopedics if not improving.  You may also schedule an appoint with a primary care provider, see information in your discharge papers, who can refer to physical therapy if needed. Apply Lidoderm patch to back as prescribed. Make Robaxin and diclofenac as needed as prescribed.  Do not drive or operate machinery if taking Robaxin. Turn to the emergency room with severe concerning symptoms.

## 2021-04-24 ENCOUNTER — Emergency Department (HOSPITAL_COMMUNITY)
Admission: EM | Admit: 2021-04-24 | Discharge: 2021-04-24 | Disposition: A | Discharge status: Home / Self Care | Payer: No Typology Code available for payment source | Attending: Emergency Medicine | Admitting: Emergency Medicine

## 2021-04-24 ENCOUNTER — Emergency Department (HOSPITAL_COMMUNITY): Payer: No Typology Code available for payment source

## 2021-04-24 ENCOUNTER — Encounter (HOSPITAL_COMMUNITY): Payer: Self-pay

## 2021-04-24 ENCOUNTER — Other Ambulatory Visit: Payer: Self-pay

## 2021-04-24 DIAGNOSIS — R5381 Other malaise: Secondary | ICD-10-CM | POA: Insufficient documentation

## 2021-04-24 DIAGNOSIS — R5383 Other fatigue: Secondary | ICD-10-CM | POA: Insufficient documentation

## 2021-04-24 DIAGNOSIS — R1110 Vomiting, unspecified: Secondary | ICD-10-CM | POA: Insufficient documentation

## 2021-04-24 DIAGNOSIS — R61 Generalized hyperhidrosis: Secondary | ICD-10-CM | POA: Insufficient documentation

## 2021-04-24 DIAGNOSIS — Z8616 Personal history of COVID-19: Secondary | ICD-10-CM | POA: Insufficient documentation

## 2021-04-24 LAB — URINALYSIS, ROUTINE W REFLEX MICROSCOPIC
Bilirubin Urine: NEGATIVE
Glucose, UA: NEGATIVE mg/dL
Hgb urine dipstick: NEGATIVE
Ketones, ur: NEGATIVE mg/dL
Leukocytes,Ua: NEGATIVE
Nitrite: NEGATIVE
Protein, ur: NEGATIVE mg/dL
Specific Gravity, Urine: 1.018 (ref 1.005–1.030)
pH: 6 (ref 5.0–8.0)

## 2021-04-24 LAB — COMPREHENSIVE METABOLIC PANEL
ALT: 25 U/L (ref 0–44)
AST: 22 U/L (ref 15–41)
Albumin: 4.1 g/dL (ref 3.5–5.0)
Alkaline Phosphatase: 46 U/L (ref 38–126)
Anion gap: 6 (ref 5–15)
BUN: 11 mg/dL (ref 6–20)
CO2: 26 mmol/L (ref 22–32)
Calcium: 9.1 mg/dL (ref 8.9–10.3)
Chloride: 106 mmol/L (ref 98–111)
Creatinine, Ser: 0.91 mg/dL (ref 0.61–1.24)
GFR, Estimated: >60 mL/min (ref >60)
Glucose, Bld: 87 mg/dL (ref 70–99)
Potassium: 4 mmol/L (ref 3.5–5.1)
Sodium: 138 mmol/L (ref 135–145)
Total Bilirubin: 0.8 mg/dL (ref 0.3–1.2)
Total Protein: 6.5 g/dL (ref 6.5–8.1)

## 2021-04-24 LAB — CBC WITH DIFFERENTIAL/PLATELET
Abs Immature Granulocytes: 0.02 10*3/uL (ref 0.00–0.07)
Basophils Absolute: 0 10*3/uL (ref 0.0–0.1)
Basophils Relative: 0 %
Eosinophils Absolute: 0.2 10*3/uL (ref 0.0–0.5)
Eosinophils Relative: 3 %
HCT: 45.9 % (ref 39.0–52.0)
Hemoglobin: 15.3 g/dL (ref 13.0–17.0)
Immature Granulocytes: 0 %
Lymphocytes Relative: 30 %
Lymphs Abs: 2.1 10*3/uL (ref 0.7–4.0)
MCH: 31.3 pg (ref 26.0–34.0)
MCHC: 33.3 g/dL (ref 30.0–36.0)
MCV: 93.9 fL (ref 80.0–100.0)
Monocytes Absolute: 0.3 10*3/uL (ref 0.1–1.0)
Monocytes Relative: 5 %
Neutro Abs: 4.3 10*3/uL (ref 1.7–7.7)
Neutrophils Relative %: 62 %
Platelets: 188 10*3/uL (ref 150–400)
RBC: 4.89 MIL/uL (ref 4.22–5.81)
RDW: 13.3 % (ref 11.5–15.5)
WBC: 6.9 10*3/uL (ref 4.0–10.5)
nRBC: 0 % (ref 0.0–0.2)

## 2021-04-24 LAB — TSH: TSH: 0.462 u[IU]/mL (ref 0.350–4.500)

## 2021-04-24 LAB — TROPONIN I (HIGH SENSITIVITY)
Troponin I (High Sensitivity): <2 ng/L (ref <18)
Troponin I (High Sensitivity): <2 ng/L (ref <18)

## 2021-04-24 NOTE — Discharge Instructions (Signed)
There were no serious problems found.  It is possible that some of your symptoms are related to having had COVID infection, 2 months ago.  It is important to get plenty of rest, drink a lot of fluids especially water, and try to eat 3 meals a day.  Start to work on some gentle exercise, beginning with walking.  For pain use Tylenol every 4 hours.  See the doctor of your choice as needed for problems.

## 2021-04-24 NOTE — ED Triage Notes (Signed)
Pt reports emesis, body aches, and fatigue that began 2 days ago. Denies diarrhea, fever, and abdominal pain.

## 2021-04-24 NOTE — ED Notes (Signed)
I provided reinforced discharge education based off of discharge instructions. Pt acknowledged and understood my education. Pt had no further questions/concerns for provider/myself.  °

## 2021-04-24 NOTE — ED Provider Triage Note (Signed)
Emergency Medicine Provider Triage Evaluation Note  Rodney Mclaughlin , a 41 y.o. male  was evaluated in triage.  Pt complains of fatigue for the past few days.  Report that he has also been having central chest pain in the mornings and night sweats at night.  He reports lack of energy.  He states that he feels lightheaded.  Reports having a few episodes of vomiting.  Review of Systems  Positive: Fatigue, vomiting Negative: Fever, chills  Physical Exam  BP 114/81 (BP Location: Right Arm)    Pulse 78    Temp 98.2 F (36.8 C) (Oral)    Resp 16    SpO2 100%  Gen:   Awake, no distress   Resp:  Normal effort  MSK:   Moves extremities without difficulty  Other:    Medical Decision Making  Medically screening exam initiated at 12:12 PM.  Appropriate orders placed.  Rodney Mclaughlin was informed that the remainder of the evaluation will be completed by another provider, this initial triage assessment does not replace that evaluation, and the importance of remaining in the ED until their evaluation is complete.  Fatigue, chest pain, vomiting   Montine Circle, PA-C 04/24/21 1214

## 2021-04-24 NOTE — ED Provider Notes (Signed)
[place] DEPT Provider Note   CSN: [id number] Arrival date & time: 04/24/21  1133     History  Chief Complaint  Patient presents with   Emesis   Fatigue    Rodney Mclaughlin is a 41 y.o. male.  HPI Patient presents for evaluation of fatigue, night sweats and tiredness.  He has had some occasional vomiting.  He has been ill for several days.  His symptoms are nonspecific and have included vomiting 3 days ago that is not persistent.  He is tolerating a normal diet and not having diarrhea.  He had a COVID infection 2 months ago.  He works as a Government social research officer.  He was in the ED couple weeks ago with a back strain from lifting a refrigerator.    Home Medications Prior to Admission medications   Medication Sig Start Date End Date Taking? Authorizing Provider  acetaminophen (TYLENOL) 325 MG tablet Take 650 mg by mouth every 6 (six) hours as needed for mild pain, fever or headache.    [provider]  famotidine (PEPCID) 20 MG tablet Take 1 tablet (20 mg total) by mouth 2 (two) times daily. 10/01/19   Jacqlyn Larsen, PA-C  lidocaine (LIDODERM) 5 % Place 1 patch onto the skin daily. Remove & Discard patch within 12 hours or as directed by MD 04/05/21   Tacy Learn, PA-C  methocarbamol (ROBAXIN) 500 MG tablet Take 1 tablet (500 mg total) by mouth 2 (two) times daily. 04/05/21   Tacy Learn, PA-C  ondansetron (ZOFRAN ODT) 4 MG disintegrating tablet 4mg  ODT q4 hours prn nausea/vomit 10/01/19   Jacqlyn Larsen, PA-C      Allergies    Patient has no known allergies.    Review of Systems   Review of Systems  Physical Exam Updated Vital Signs BP 108/69 (BP Location: Left Arm)    Pulse 80    Temp 98.2 F (36.8 C) (Oral)    Resp 18    SpO2 99%  Physical Exam Vitals and nursing note reviewed.  Constitutional:      Appearance: He is well-developed. He is not ill-appearing.  HENT:     Head: Normocephalic and atraumatic.     Right Ear:  External ear normal.     Left Ear: External ear normal.  Eyes:     Conjunctiva/sclera: Conjunctivae normal.     Pupils: Pupils are equal, round, and reactive to light.  Neck:     Trachea: Phonation normal.  Cardiovascular:     Rate and Rhythm: Normal rate and regular rhythm.     Heart sounds: Normal heart sounds.  Pulmonary:     Effort: Pulmonary effort is normal.     Breath sounds: Normal breath sounds.  Abdominal:     General: There is no distension.     Palpations: Abdomen is soft.  Musculoskeletal:        General: Normal range of motion.     Cervical back: Normal range of motion and neck supple.  Skin:    General: Skin is warm and dry.  Neurological:     Mental Status: He is alert and oriented to person, place, and time.     Cranial Nerves: No cranial nerve deficit.     Sensory: No sensory deficit.     Motor: No abnormal muscle tone.     Coordination: Coordination normal.  Psychiatric:        Mood and Affect: Mood normal.  Behavior: Behavior normal.        Thought Content: Thought content normal.        Judgment: Judgment normal.    ED Results / Procedures / Treatments   Labs (all labs ordered are listed, but only abnormal results are displayed) Labs Reviewed  CBC WITH DIFFERENTIAL/PLATELET  COMPREHENSIVE METABOLIC PANEL  URINALYSIS, ROUTINE W REFLEX MICROSCOPIC  TSH  TROPONIN I (HIGH SENSITIVITY)  TROPONIN I (HIGH SENSITIVITY)    EKG EKG Interpretation  Date/Time:  Wednesday April 24 2021 18:37:38 EST Ventricular Rate:  57 PR Interval:  146 QRS Duration: 90 QT Interval:  404 QTC Calculation: 394 R Axis:   87 Text Interpretation: Sinus rhythm No old tracing to compare Confirmed by Daleen Bo [phone]) on 04/24/2021 6:44:19 PM  Radiology DG Chest 2 View  Result Date: 04/24/2021 CLINICAL DATA:  Chest pain. Additional history provided: Emesis, body aches, fatigue which began 2 days ago. EXAM: CHEST - 2 VIEW COMPARISON:  Radiographs of the chest  and right ribs 05/30/2013. FINDINGS: Heart size within normal limits. No appreciable airspace consolidation. There is a thin vertically-oriented linear opacity projecting in the region of the right lung base on the PA radiograph only, likely artifactual. No evidence of pleural effusion or pneumothorax. No acute bony abnormality identified. IMPRESSION: No evidence of active cardiopulmonary disease. Electronically Signed   By: Kellie Simmering D.O.   On: 04/24/2021 12:36    Procedures Procedures    Medications Ordered in ED Medications - No data to display  ED Course/ Medical Decision Making/ A&P                           Medical Decision Making Patient presenting with nonspecific symptoms following COVID infection 2 months ago.  He has had an episode of vomiting did not recur.  He does not have chronic illnesses.  Amount and/or Complexity of Data Reviewed Independent Historian:     Details: He is a cogent historian Labs: ordered.    Details: Labs ordered to evaluate for causes of fatigue, weakness and vomiting.  CBC, metabolic panel, urinalysis, troponin, TSH-test results all normal. Radiology: ordered and independent interpretation performed.    Details: Chest x-ray-no infiltrate or edema, viewed by me ECG/medicine tests: ordered.    Details: Cardiac monitor-normal sinus rhythm  Risk OTC drugs. Decision regarding hospitalization. Risk Details: Patient with nonspecific complaints, screening labs ordered to look for complications from recent COVID infection or occult medical illness including metabolic disorders, occult infection and renal failure.  Screening evaluation is completely normal.  Patient may have long COVID syndrome however he has normal vital signs and does not require hospitalization for this illness.  I have recommended symptomatic treatment with Tylenol, rest, push fluids and advance diet as tolerated.           Final Clinical Impression(s) / ED Diagnoses Final  diagnoses:  Malaise    Rx / DC Orders ED Discharge Orders     None         Daleen Bo, MD 04/24/21 [phone]

## 2022-08-06 ENCOUNTER — Emergency Department (HOSPITAL_COMMUNITY)
Admission: EM | Admit: 2022-08-06 | Discharge: 2022-08-06 | Disposition: A | Discharge status: Home / Self Care | Payer: No Typology Code available for payment source | Attending: Emergency Medicine | Admitting: Emergency Medicine

## 2022-08-06 ENCOUNTER — Other Ambulatory Visit: Payer: Self-pay

## 2022-08-06 DIAGNOSIS — S0181XA Laceration without foreign body of other part of head, initial encounter: Secondary | ICD-10-CM | POA: Insufficient documentation

## 2022-08-06 DIAGNOSIS — W228XXA Striking against or struck by other objects, initial encounter: Secondary | ICD-10-CM | POA: Insufficient documentation

## 2022-08-06 DIAGNOSIS — Y990 Civilian activity done for income or pay: Secondary | ICD-10-CM | POA: Insufficient documentation

## 2022-08-06 NOTE — ED Provider Notes (Signed)
[place] EMERGENCY DEPARTMENT AT [facility] Provider Note   CSN: [id number] Arrival date & time: 08/06/22  2147     History  Chief Complaint  Patient presents with   Facial Injury    Rodney Mclaughlin is a 42 y.o. male with no documented medical history.  The patient presents to the ED for evaluation of periorbital laceration to his right eye.  The patient reports that prior to arrival he was working with a pipe wrench when it struck him in the face.  The patient reports he has a very small laceration underneath his right eye.  The patient states that he got his tetanus updated 1 year ago.  He denies any pain to his eye.  He denies any discharge from his eye.  He denies any pain around his eye.   Facial Injury      Home Medications Prior to Admission medications   Medication Sig Start Date End Date Taking? Authorizing Provider  acetaminophen (TYLENOL) 325 MG tablet Take 650 mg by mouth every 6 (six) hours as needed for mild pain, fever or headache.    [provider]  famotidine (PEPCID) 20 MG tablet Take 1 tablet (20 mg total) by mouth 2 (two) times daily. 10/01/19   Dartha Lodge, PA-C  lidocaine (LIDODERM) 5 % Place 1 patch onto the skin daily. Remove & Discard patch within 12 hours or as directed by MD 04/05/21   Jeannie Fend, PA-C  methocarbamol (ROBAXIN) 500 MG tablet Take 1 tablet (500 mg total) by mouth 2 (two) times daily. 04/05/21   Jeannie Fend, PA-C  ondansetron (ZOFRAN ODT) 4 MG disintegrating tablet 4mg  ODT q4 hours prn nausea/vomit 10/01/19   Dartha Lodge, PA-C      Allergies    Patient has no known allergies.    Review of Systems   Review of Systems  Eyes:  Negative for pain, discharge and redness.  Skin:  Positive for wound.  All other systems reviewed and are negative.   Physical Exam Updated Vital Signs BP 116/81 (BP Location: Right Arm)   Pulse 83   Temp 98.1 F (36.7 C)   Resp 16   Ht 5\' 8"  (1.727 m)   Wt 72.6 kg   SpO2 99%    BMI 24.33 kg/m  Physical Exam Vitals and nursing note reviewed.  Constitutional:      General: He is not in acute distress.    Appearance: He is well-developed.  HENT:     Head: Normocephalic and atraumatic.  Eyes:     Extraocular Movements: Extraocular movements intact.     Conjunctiva/sclera: Conjunctivae normal.     Pupils: Pupils are equal, round, and reactive to light.     Comments: Patient EOM intact, nonpainful. No TTP periorbitally. No ecchymosis or erythema.   Cardiovascular:     Rate and Rhythm: Normal rate and regular rhythm.     Heart sounds: No murmur heard. Pulmonary:     Effort: Pulmonary effort is normal. No respiratory distress.     Breath sounds: Normal breath sounds.  Abdominal:     Palpations: Abdomen is soft.     Tenderness: There is no abdominal tenderness.  Musculoskeletal:        General: No swelling.     Cervical back: Neck supple.  Skin:    General: Skin is warm and dry.     Capillary Refill: Capillary refill takes less than 2 seconds.     Comments: 3cm laceration inferior  to right eye  Neurological:     Mental Status: He is alert and oriented to person, place, and time.  Psychiatric:        Mood and Affect: Mood normal.     ED Results / Procedures / Treatments   Labs (all labs ordered are listed, but only abnormal results are displayed) Labs Reviewed - No data to display  EKG None  Radiology No results found.  Procedures Procedures   Medications Ordered in ED Medications - No data to display  ED Course/ Medical Decision Making/ A&P  Medical Decision Making  42 year old male presents to the ED for evaluation.  Please see HPI for further details.  On examination the patient has a 3 cm laceration inferior to his right orbit.  His EOMs are intact, they are nonpainful.  He has no tenderness to palpation periorbitally.  He has no ecchymosis or erythema surrounding his eye.  He has no conjunctival injection, scleral injection.  He has  a negative Seidel sign.  No evidence of hyphema.  Visual acuity intact.  Patient has no tenderness periorbitally, no tenderness to the bridge of his nose.  I do not feel that this patient requires CT maxillofacial to assess for underlying fracture.  The patient had his wound irrigated, closed with Dermabond.  The patient was amenable to this.  The patient will follow-up with his PCP for further management.  The patient was given return precautions and he voiced understanding.  All questions answered his satisfaction.  Stable to discharge home.   Final Clinical Impression(s) / ED Diagnoses Final diagnoses:  Facial laceration, initial encounter    Rx / DC Orders ED Discharge Orders     None         Clent Ridges 08/06/22 2207    Rondel Baton, MD 08/07/22 [phone]

## 2022-08-06 NOTE — ED Triage Notes (Signed)
Pt arrives to ED c/o self inflicted facial injury to right cheek under eye with pipe wrench while working. No LOC, no thinners. Pt a/o x 4

## 2022-08-06 NOTE — Discharge Instructions (Addendum)
Return to the ED with any new symptoms such as pain while moving your eye, pain around your eye You take ibuprofen or Tylenol every 6 hours as needed for pain Please read the attached guide concerning nonsutured laceration care.  Your Dermabond will flake off over the next 10 days. You may follow-up with your primary care doctor for further management and reevaluation

## 2022-10-30 IMAGING — CR DG LUMBAR SPINE COMPLETE 4+V
5 series · 5 of 5 positions shown · non-contrast
Comparison: CT 10/01/2019

CLINICAL DATA: low back pain, injury

EXAM:
LUMBAR SPINE - COMPLETE 4+ VIEW

[t lumbar spine ap]
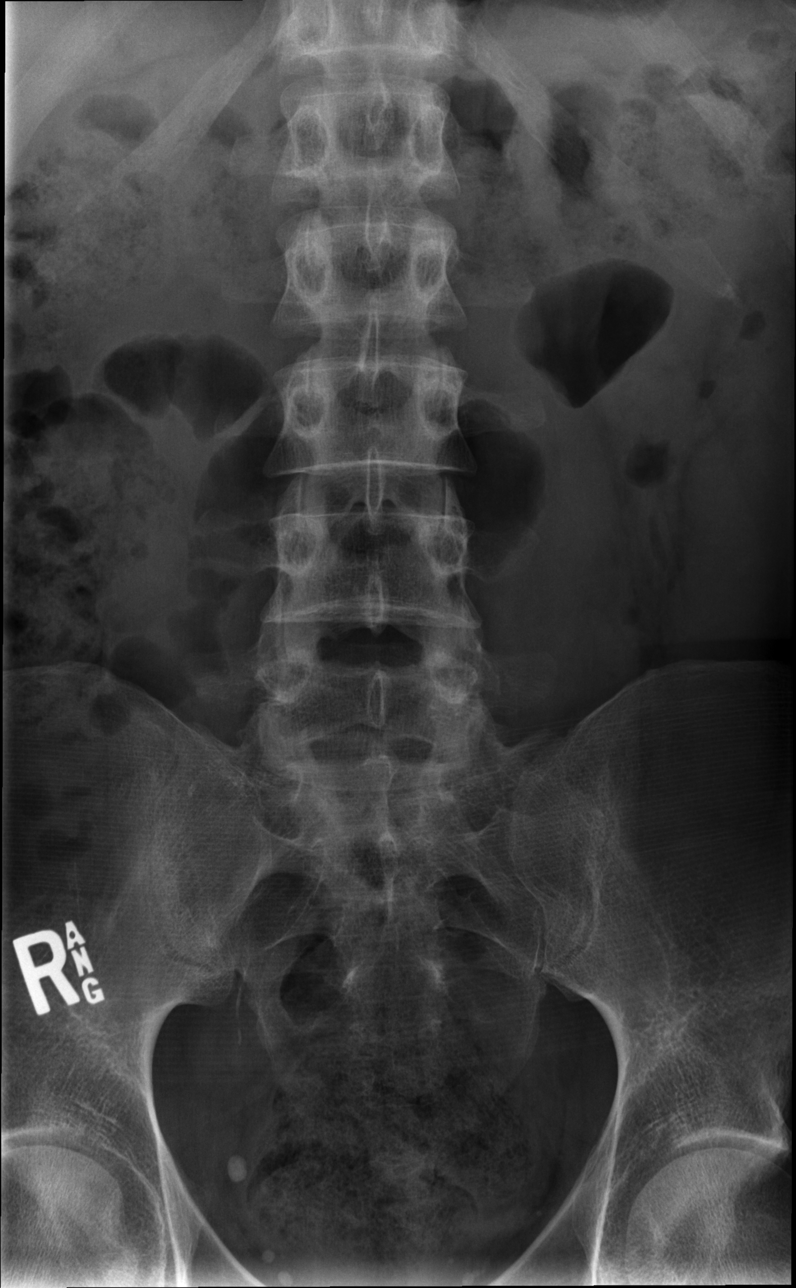

[t lumbar spine obl (1 of 2)]
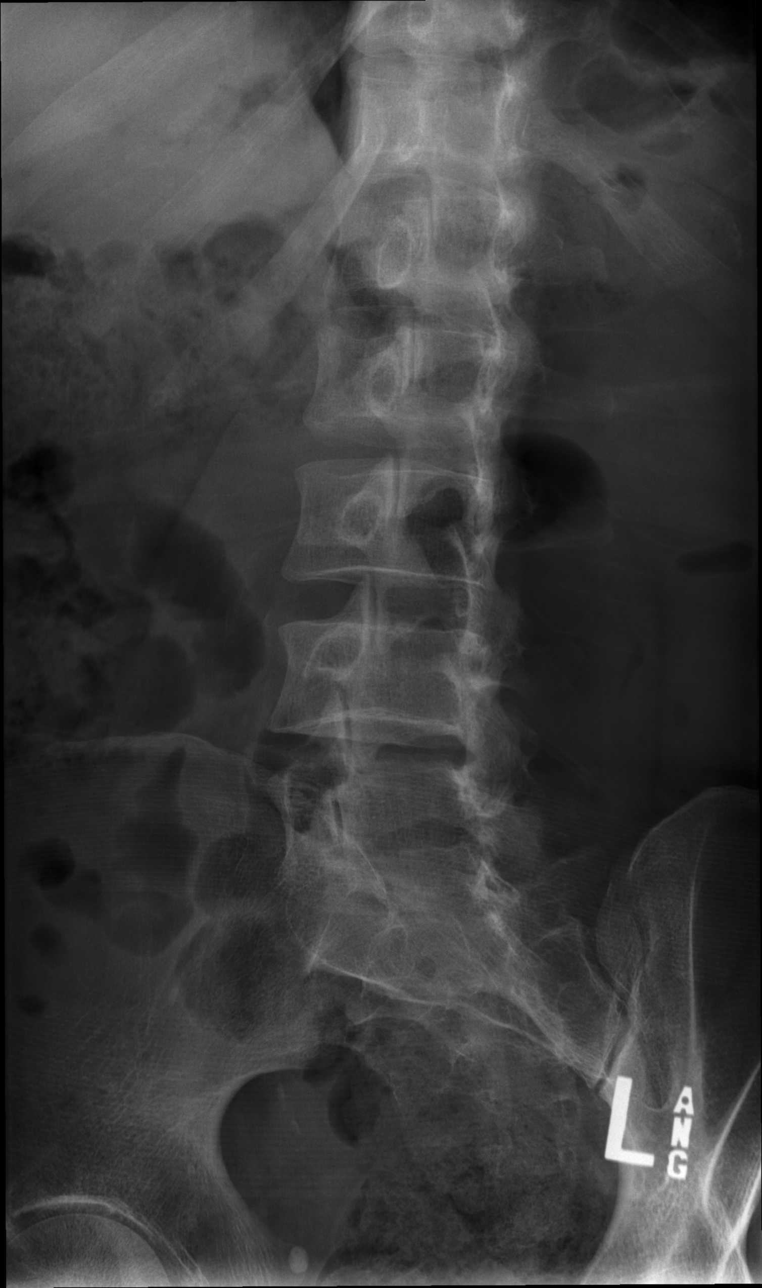

[t lumbar spine obl (2 of 2)]
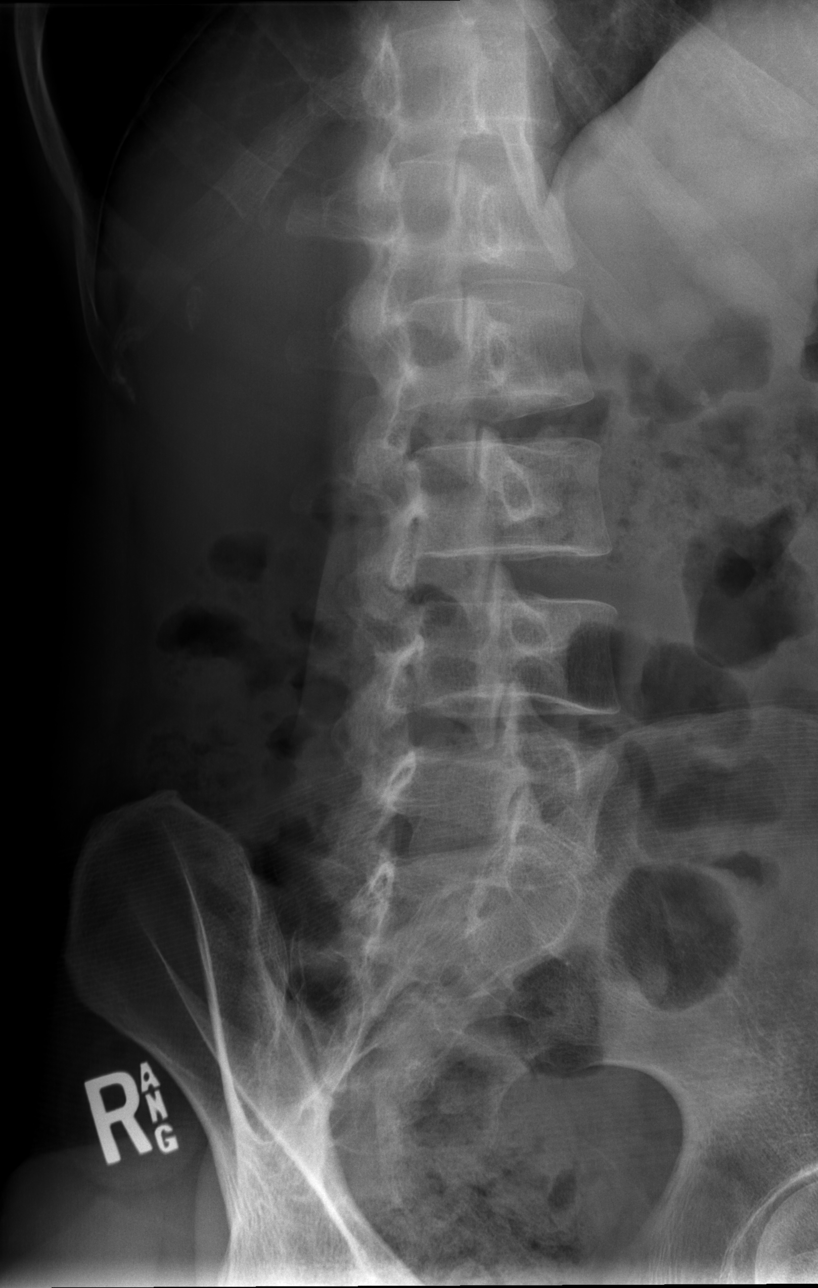

[t lumbar spine lat]
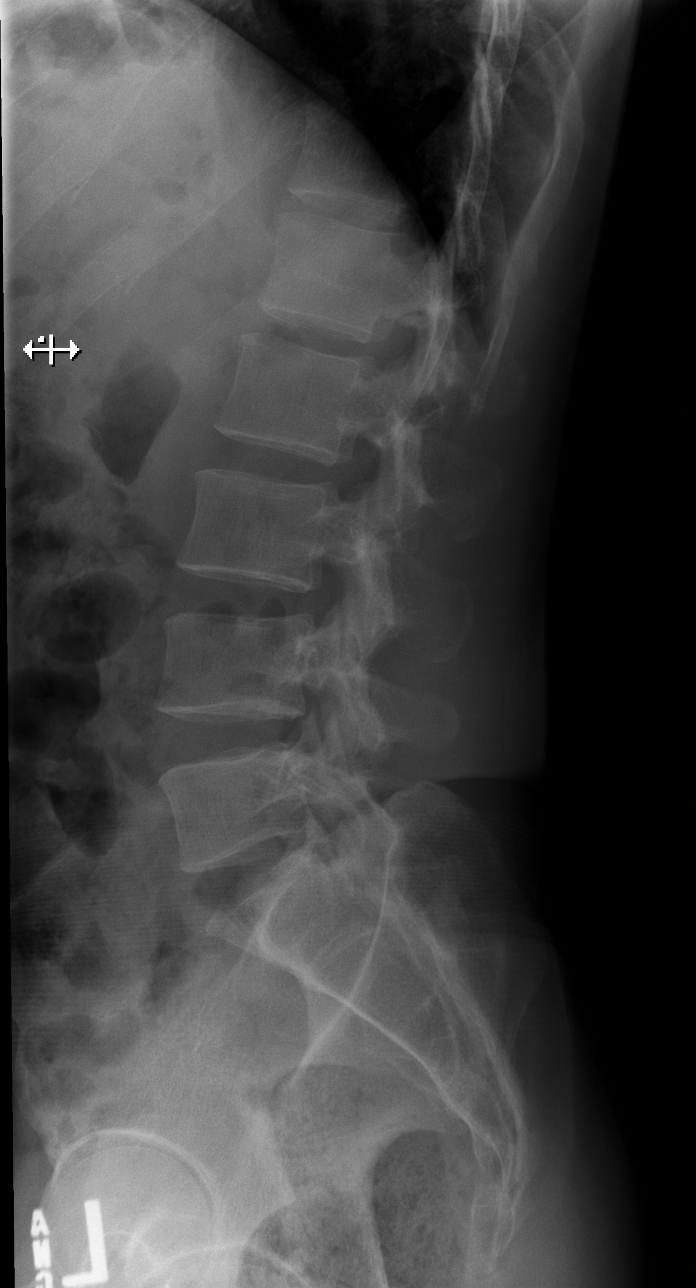

[t lumbar l-5 s-1 spot]
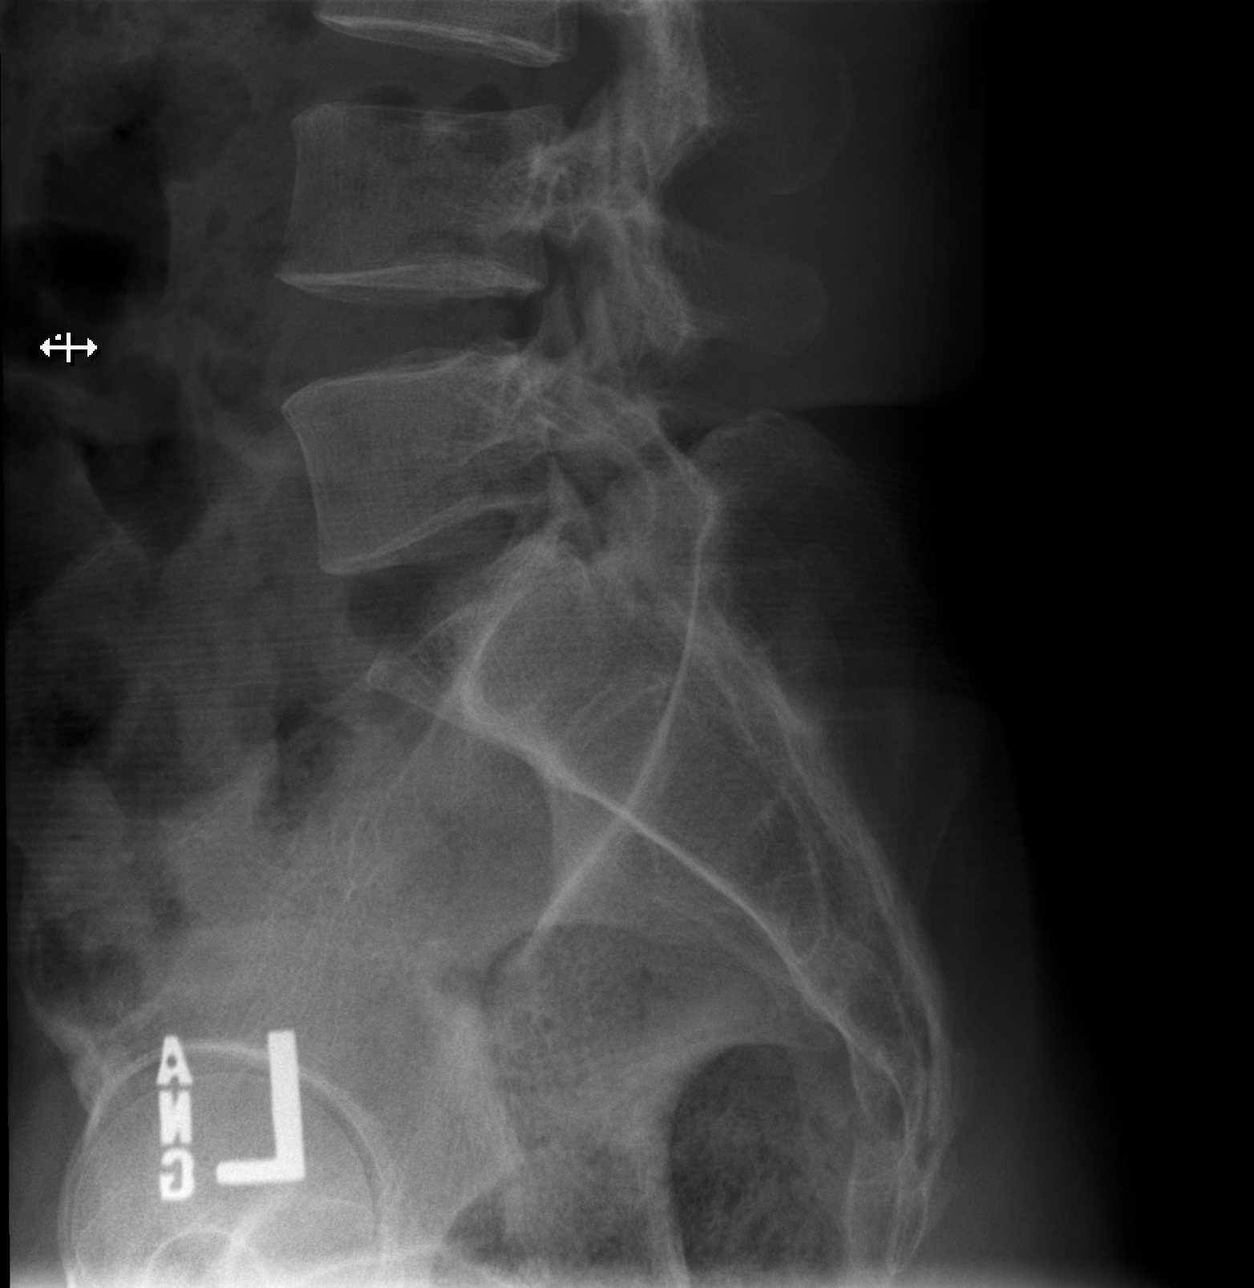

[5 of 5 positions shown; findings below may reference images not displayed]

FINDINGS: There are 5 non-rib-bearing lumbar vertebrae. There is no evidence
of lumbar spine fracture. Normal alignment. Disc heights are
preserved.
IMPRESSION: Negative lumbar spine radiographs.

## 2022-11-18 IMAGING — CR DG CHEST 2V
2 series · 2 of 2 positions shown · non-contrast
Comparison: Radiographs of the chest and right ribs 05/30/2013.

CLINICAL DATA: Chest pain. Additional history provided: Emesis,
body aches, fatigue which began 2 days ago.

EXAM:
CHEST - 2 VIEW

[w chest pa]
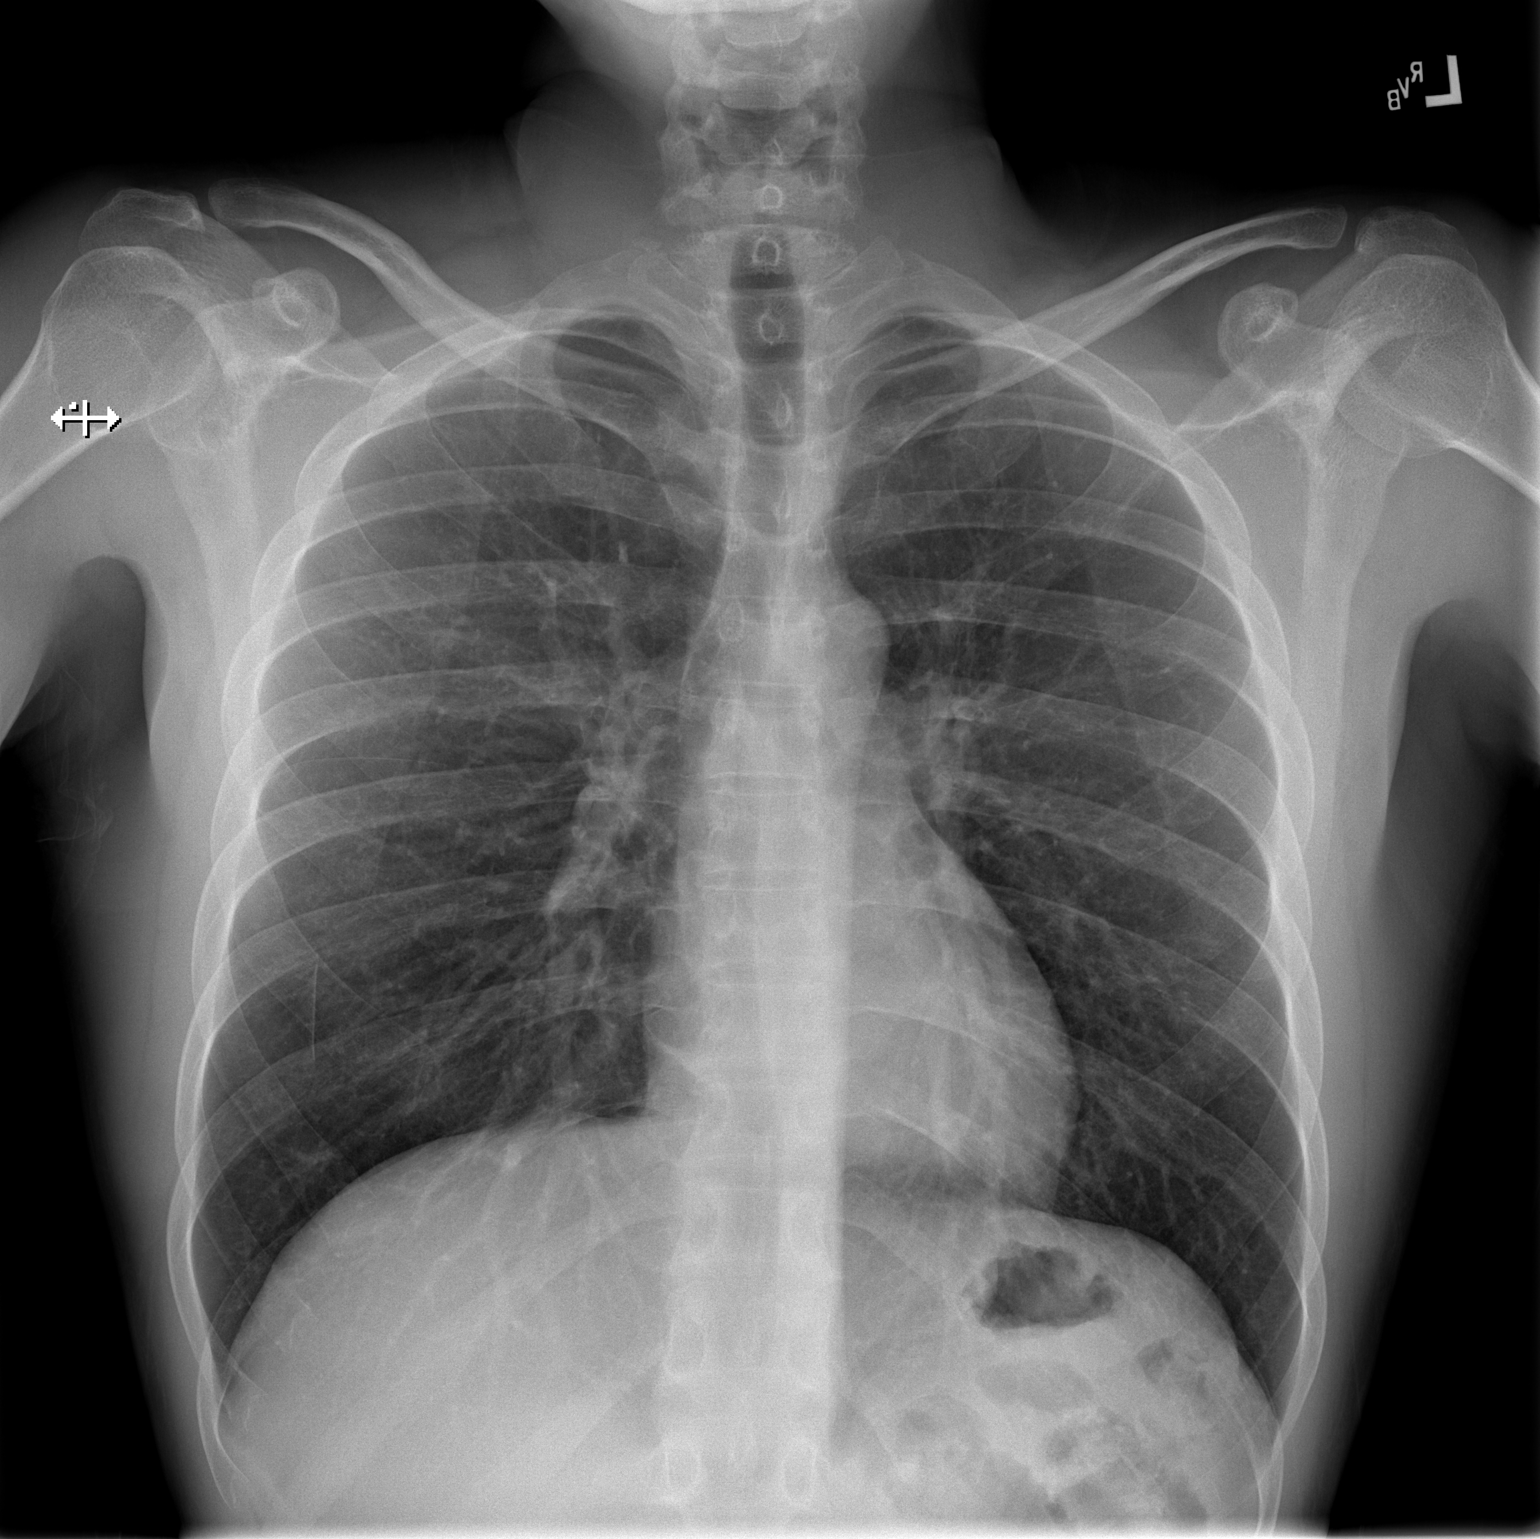

[w chest lat]
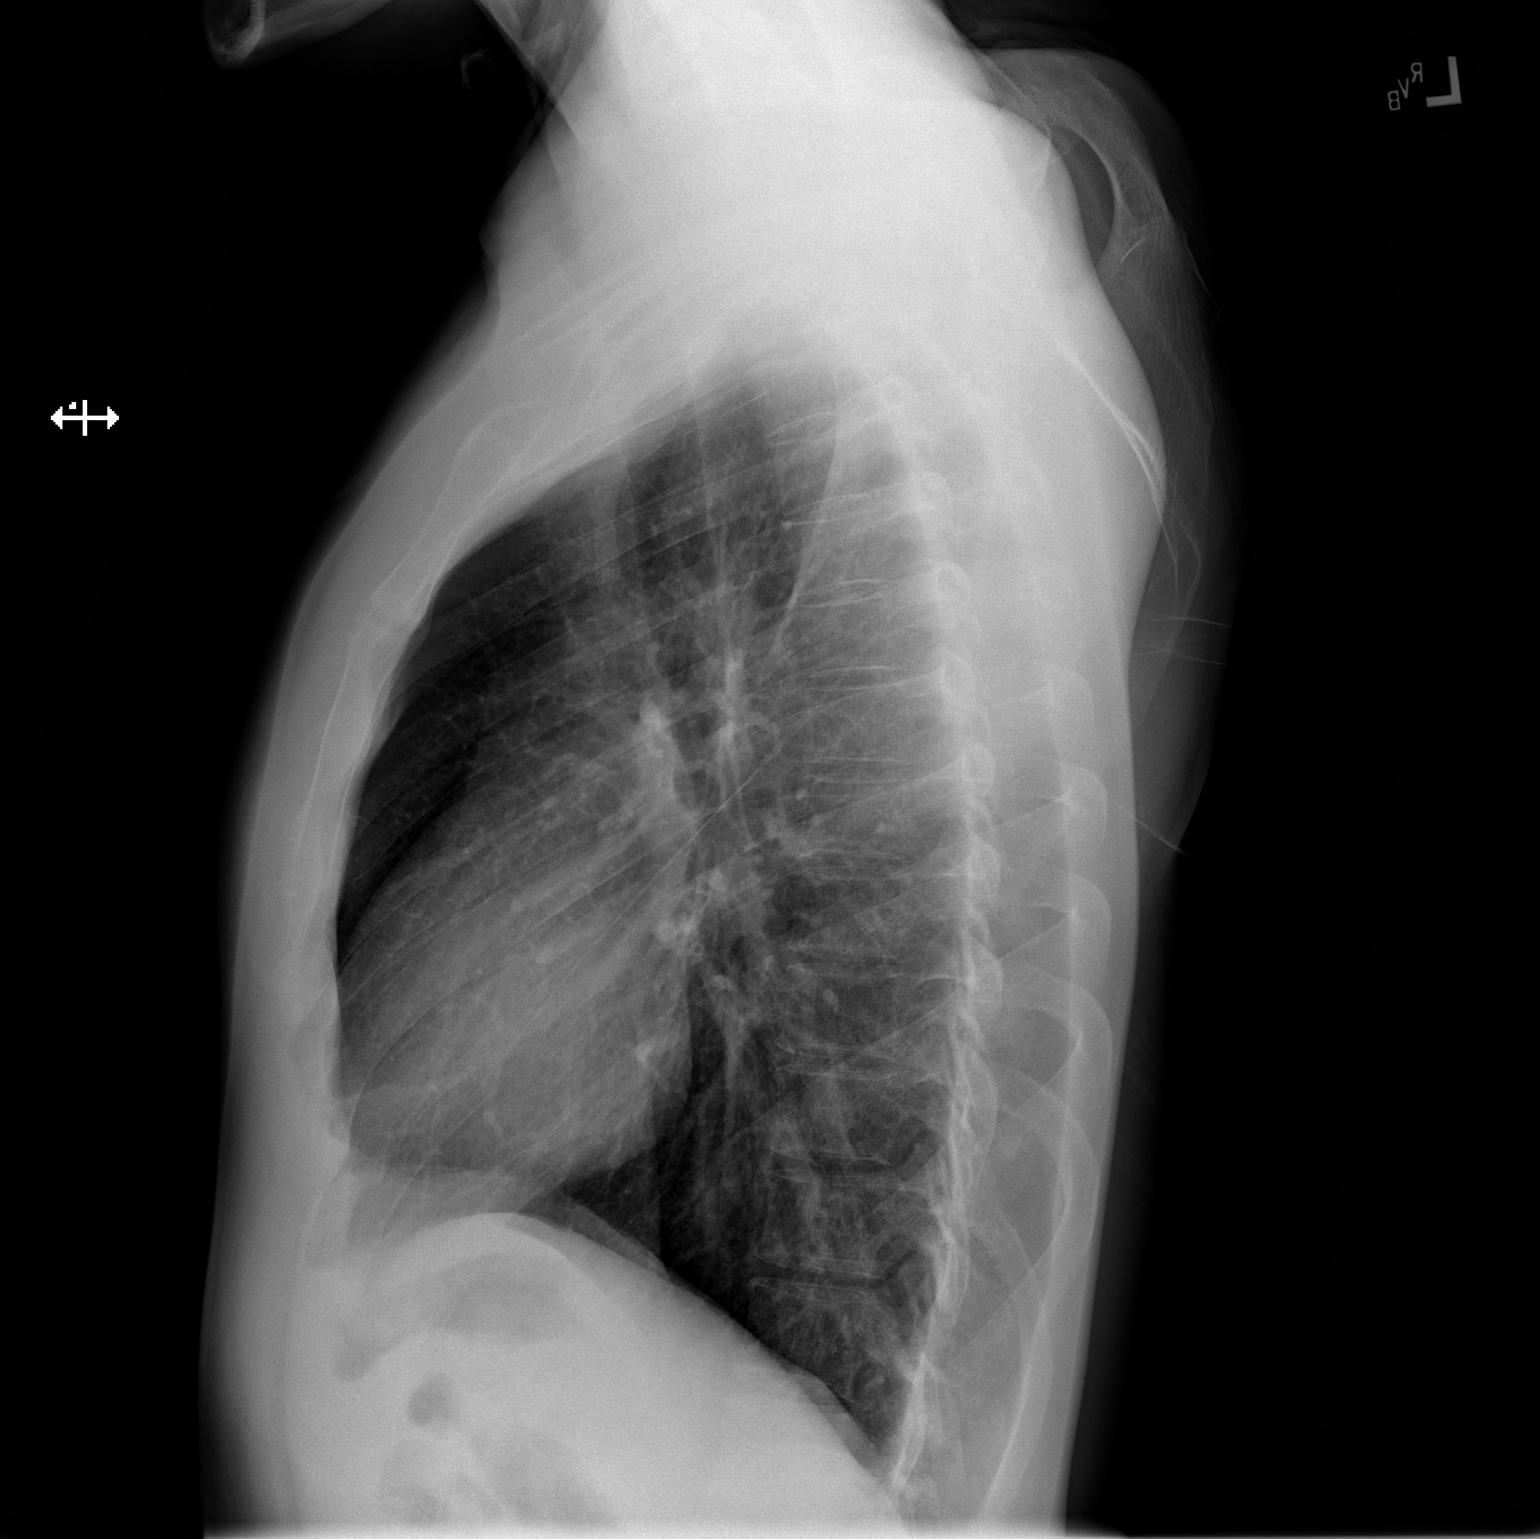

[2 of 2 positions shown; findings below may reference images not displayed]

FINDINGS: Heart size within normal limits. No appreciable airspace
consolidation. There is a thin vertically-oriented linear opacity
projecting in the region of the right lung base on the PA radiograph
only, likely artifactual. No evidence of pleural effusion or
pneumothorax. No acute bony abnormality identified.
IMPRESSION: No evidence of active cardiopulmonary disease.

## 2024-03-17 ENCOUNTER — Encounter (HOSPITAL_BASED_OUTPATIENT_CLINIC_OR_DEPARTMENT_OTHER): Payer: Self-pay

## 2024-03-17 DIAGNOSIS — R109 Unspecified abdominal pain: Secondary | ICD-10-CM | POA: Diagnosis not present

## 2024-03-17 DIAGNOSIS — R112 Nausea with vomiting, unspecified: Secondary | ICD-10-CM | POA: Diagnosis present

## 2024-03-17 LAB — CBC WITH DIFFERENTIAL/PLATELET
Abs Immature Granulocytes: 0.02 K/uL (ref 0.00–0.07)
Basophils Absolute: 0 K/uL (ref 0.0–0.1)
Basophils Relative: 0 %
Eosinophils Absolute: 0 K/uL (ref 0.0–0.5)
Eosinophils Relative: 0 %
HCT: 44.6 % (ref 39.0–52.0)
Hemoglobin: 15 g/dL (ref 13.0–17.0)
Immature Granulocytes: 0 %
Lymphocytes Relative: 27 %
Lymphs Abs: 2.2 K/uL (ref 0.7–4.0)
MCH: 30.8 pg (ref 26.0–34.0)
MCHC: 33.6 g/dL (ref 30.0–36.0)
MCV: 91.6 fL (ref 80.0–100.0)
Monocytes Absolute: 0.5 K/uL (ref 0.1–1.0)
Monocytes Relative: 7 %
Neutro Abs: 5.5 K/uL (ref 1.7–7.7)
Neutrophils Relative %: 66 %
Platelets: 222 K/uL (ref 150–400)
RBC: 4.87 MIL/uL (ref 4.22–5.81)
RDW: 13.5 % (ref 11.5–15.5)
WBC: 8.3 K/uL (ref 4.0–10.5)
nRBC: 0 % (ref 0.0–0.2)

## 2024-03-17 LAB — COMPREHENSIVE METABOLIC PANEL WITH GFR
ALT: 25 U/L (ref 0–44)
AST: 23 U/L (ref 15–41)
Albumin: 4.8 g/dL (ref 3.5–5.0)
Alkaline Phosphatase: 57 U/L (ref 38–126)
Anion gap: 15 (ref 5–15)
BUN: 10 mg/dL (ref 6–20)
CO2: 24 mmol/L (ref 22–32)
Calcium: 10.1 mg/dL (ref 8.9–10.3)
Chloride: 103 mmol/L (ref 98–111)
Creatinine, Ser: 1.16 mg/dL (ref 0.61–1.24)
GFR, Estimated: >60 mL/min
Glucose, Bld: 101 mg/dL — ABNORMAL HIGH (ref 70–99)
Potassium: 3.8 mmol/L (ref 3.5–5.1)
Sodium: 142 mmol/L (ref 135–145)
Total Bilirubin: 1.4 mg/dL — ABNORMAL HIGH (ref 0.0–1.2)
Total Protein: 7.3 g/dL (ref 6.5–8.1)

## 2024-03-17 LAB — URINALYSIS, ROUTINE W REFLEX MICROSCOPIC
Bacteria, UA: NONE SEEN
Bilirubin Urine: NEGATIVE
Glucose, UA: NEGATIVE mg/dL
Hgb urine dipstick: NEGATIVE
Ketones, ur: 40 mg/dL — AB
Leukocytes,Ua: NEGATIVE
Nitrite: NEGATIVE
Protein, ur: 30 mg/dL — AB
Specific Gravity, Urine: 1.029 (ref 1.005–1.030)
pH: 8.5 — ABNORMAL HIGH (ref 5.0–8.0)

## 2024-03-17 MED ORDER — ONDANSETRON HCL 4 MG/2ML IJ SOLN
4.0000 mg | Freq: Once | INTRAMUSCULAR | Status: AC
  Administered 2024-03-17: 4 mg via INTRAVENOUS
  Filled 2024-03-17: qty 2

## 2024-03-17 NOTE — ED Triage Notes (Signed)
 Pt states he has been vomiting x 3 days Can not keep anything down Generalized abdominal pain (cramping)

## 2024-03-18 ENCOUNTER — Emergency Department (HOSPITAL_BASED_OUTPATIENT_CLINIC_OR_DEPARTMENT_OTHER)
Admission: EM | Admit: 2024-03-18 | Discharge: 2024-03-18 | Disposition: A | Discharge status: Home / Self Care | Attending: Emergency Medicine | Admitting: Emergency Medicine

## 2024-03-18 DIAGNOSIS — R109 Unspecified abdominal pain: Secondary | ICD-10-CM

## 2024-03-18 DIAGNOSIS — R112 Nausea with vomiting, unspecified: Secondary | ICD-10-CM

## 2024-03-18 MED ORDER — HYOSCYAMINE SULFATE SL 0.125 MG SL SUBL: 1.0000 | SUBLINGUAL_TABLET | Freq: Four times a day (QID) | SUBLINGUAL | 0 refills | Status: AC | PRN

## 2024-03-18 MED ORDER — HYOSCYAMINE SULFATE 0.125 MG SL SUBL
0.2500 mg | SUBLINGUAL_TABLET | Freq: Once | SUBLINGUAL | Status: AC
  Administered 2024-03-18: 0.25 mg via SUBLINGUAL
  Filled 2024-03-18: qty 2

## 2024-03-18 MED ORDER — ALUM & MAG HYDROXIDE-SIMETH 200-200-20 MG/5ML PO SUSP
30.0000 mL | Freq: Once | ORAL | Status: AC
  Administered 2024-03-18: 30 mL via ORAL
  Filled 2024-03-18: qty 30

## 2024-03-18 NOTE — ED Provider Notes (Addendum)
 " [place] EMERGENCY DEPARTMENT AT [facility] Provider Note  CSN: [id number] Arrival date & time: 03/17/24 2221  Chief Complaint(s) Emesis  History provided by patient. HPI & MDM Rodney Mclaughlin is a 44 y.o. male with no pertinent past medical history who presents to the emergency department with 3 days of intermittent abdominal cramping with nausea and vomiting.  Patient reports decreased p.o. tolerance.  Denies any diarrhea.  No recent fevers or infections.  No known sick contacts.  Patient was seen at urgent care yesterday and had reassuring labs including CBC, CMP and lipase.  HPI   Differential diagnosis considered.  Workup below.  Abdomen benign and not concerning for serious intra-abdominal inflammatory/infectious process or bowel obstruction. CBC without leukocytosis.  No anemia. Metabolic panel without significant electrolyte derangements or renal sufficiency.  No evidence of bili obstruction. Given reassuring lipase yesterday with nontender abdomen, no need for repeat at this time. UA without evidence of infection.  Patient given GI cocktail, ODT Zofran  and Levsin .  Tolerating p.o.   No need for advanced imaging at this time.    Medical Decision Making Amount and/or Complexity of Data Reviewed Labs: ordered. Decision-making details documented in ED Course.  Risk OTC drugs. Prescription drug management.    Final Clinical Impression(s) / ED Diagnoses Final diagnoses:  Abdominal cramping  Nausea and vomiting in adult   The patient appears reasonably screened and/or stabilized for discharge and I doubt any other medical condition or other [facility] requiring further screening, evaluation, or treatment in the ED at this time. I have discussed the findings, Dx and Tx plan with the patient/family who expressed understanding and agree(s) with the plan. Discharge instructions discussed at length. The patient/family was given strict return precautions who  verbalized understanding of the instructions. No further questions at time of discharge.  Disposition: Discharge  Condition: Good  ED Discharge Orders          Ordered    Hyoscyamine  Sulfate SL (LEVSIN /SL) 0.125 MG SUBL  4 times daily PRN        03/18/24 0244              Follow Up: Primary care provider  Call  to schedule an appointment for close follow up     Past Medical History History reviewed. No pertinent past medical history. There are no active problems to display for this patient.  Home Medication(s) Prior to Admission medications  Medication Sig Start Date End Date Taking? Authorizing Provider  Hyoscyamine  Sulfate SL (LEVSIN /SL) 0.125 MG SUBL Place 1 tablet (0.125 mg total) under the tongue 4 (four) times daily as needed. 03/18/24 03/23/24 Yes Perle Gibbon, Raynell Moder, MD  acetaminophen  (TYLENOL ) 325 MG tablet Take 650 mg by mouth every 6 (six) hours as needed for mild pain, fever or headache.    [provider]  famotidine  (PEPCID ) 20 MG tablet Take 1 tablet (20 mg total) by mouth 2 (two) times daily. 10/01/19   Kehrli, Kelsey F, PA-C  lidocaine  (LIDODERM ) 5 % Place 1 patch onto the skin daily. Remove & Discard patch within 12 hours or as directed by MD 04/05/21   Beverley Leita LABOR, PA-C  methocarbamol  (ROBAXIN ) 500 MG tablet Take 1 tablet (500 mg total) by mouth 2 (two) times daily. 04/05/21   Beverley Leita LABOR, PA-C  ondansetron  (ZOFRAN  ODT) 4 MG disintegrating tablet 4mg  ODT q4 hours prn nausea/vomit 10/01/19   Kehrli, Kelsey F, PA-C  Allergies Patient has no known allergies.  Review of Systems Review of Systems As noted in HPI  Physical Exam Vital Signs  I have reviewed the triage vital signs BP 122/82 (BP Location: Right Arm)   Pulse (!) 51   Temp 98.2 F (36.8 C) (Oral)   Resp (!) 24   Ht 5' 8 (1.727 m)   Wt 72.6 kg    SpO2 100%   BMI 24.33 kg/m   Physical Exam Vitals reviewed.  Constitutional:      General: He is not in acute distress.    Appearance: He is well-developed. He is not diaphoretic.  HENT:     Head: Normocephalic and atraumatic.     Right Ear: External ear normal.     Left Ear: External ear normal.     Nose: Nose normal.     Mouth/Throat:     Mouth: Mucous membranes are moist.  Eyes:     General: No scleral icterus.    Conjunctiva/sclera: Conjunctivae normal.  Neck:     Trachea: Phonation normal.  Cardiovascular:     Rate and Rhythm: Normal rate and regular rhythm.  Pulmonary:     Effort: Pulmonary effort is normal. No respiratory distress.     Breath sounds: No stridor.  Abdominal:     General: There is no distension.     Tenderness: There is no abdominal tenderness. There is no guarding or rebound.  Musculoskeletal:        General: Normal range of motion.     Cervical back: Normal range of motion.  Neurological:     Mental Status: He is alert and oriented to person, place, and time.  Psychiatric:        Behavior: Behavior normal.     ED Results and Treatments Labs (all labs ordered are listed, but only abnormal results are displayed) Labs Reviewed  COMPREHENSIVE METABOLIC PANEL WITH GFR - Abnormal; Notable for the following components:      Result Value   Glucose, Bld 101 (*)    Total Bilirubin 1.4 (*)    All other components within normal limits  URINALYSIS, ROUTINE W REFLEX MICROSCOPIC - Abnormal; Notable for the following components:   APPearance HAZY (*)    pH 8.5 (*)    Ketones, ur 40 (*)    Protein, ur 30 (*)    All other components within normal limits  CBC WITH DIFFERENTIAL/PLATELET                                                                                                                         EKG  EKG Interpretation Date/Time:    Ventricular Rate:    PR Interval:    QRS Duration:    QT Interval:    QTC Calculation:   R Axis:       Text Interpretation:         Radiology No results found.  Medications Ordered in ED Medications  ondansetron  (ZOFRAN ) injection 4 mg (4  mg Intravenous Given 03/17/24 2250)  alum & mag hydroxide-simeth (MAALOX/MYLANTA) 200-200-20 MG/5ML suspension 30 mL (30 mLs Oral Given 03/18/24 0235)  hyoscyamine  (LEVSIN  SL) SL tablet 0.25 mg (0.25 mg Sublingual Given 03/18/24 0235)   Procedures Procedures  (including critical care time)   This chart was dictated using voice recognition software.  Despite best efforts to proofread,  errors can occur which can change the documentation meaning.     Trine Raynell Moder, MD 03/18/24 [phone]  "
